# Patient Record
Sex: Male | Born: 1990 | Race: Black or African American | Hispanic: No | Marital: Single | Smoking: Never smoker
Health system: Southern US, Community
[De-identification: ages and names within clinical notes are randomized; demographics above are authoritative.]

---
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
## 2015-04-02 ENCOUNTER — Emergency Department (HOSPITAL_COMMUNITY)
Admission: EM | Admit: 2015-04-02 | Discharge: 2015-04-03 | Disposition: A | Discharge status: Home / Self Care | Payer: BLUE CROSS/BLUE SHIELD | Attending: Emergency Medicine | Admitting: Emergency Medicine

## 2015-04-02 DIAGNOSIS — Y9389 Activity, other specified: Secondary | ICD-10-CM | POA: Diagnosis not present

## 2015-04-02 DIAGNOSIS — S20311A Abrasion of right front wall of thorax, initial encounter: Secondary | ICD-10-CM | POA: Diagnosis not present

## 2015-04-02 DIAGNOSIS — S299XXA Unspecified injury of thorax, initial encounter: Secondary | ICD-10-CM | POA: Diagnosis present

## 2015-04-02 DIAGNOSIS — Y9289 Other specified places as the place of occurrence of the external cause: Secondary | ICD-10-CM | POA: Diagnosis not present

## 2015-04-02 DIAGNOSIS — Y249XXA Unspecified firearm discharge, undetermined intent, initial encounter: Secondary | ICD-10-CM

## 2015-04-02 DIAGNOSIS — Z23 Encounter for immunization: Secondary | ICD-10-CM | POA: Insufficient documentation

## 2015-04-02 DIAGNOSIS — S21101A Unspecified open wound of right front wall of thorax without penetration into thoracic cavity, initial encounter: Secondary | ICD-10-CM | POA: Insufficient documentation

## 2015-04-02 DIAGNOSIS — W3400XA Accidental discharge from unspecified firearms or gun, initial encounter: Secondary | ICD-10-CM | POA: Diagnosis not present

## 2015-04-02 DIAGNOSIS — Y998 Other external cause status: Secondary | ICD-10-CM | POA: Insufficient documentation
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
## 2015-04-03 ENCOUNTER — Encounter (HOSPITAL_COMMUNITY): Payer: Self-pay | Admitting: *Deleted

## 2015-04-03 ENCOUNTER — Emergency Department (HOSPITAL_COMMUNITY): Payer: BLUE CROSS/BLUE SHIELD

## 2015-04-03 MED ORDER — TETANUS-DIPHTH-ACELL PERTUSSIS 5-2.5-18.5 LF-MCG/0.5 IM SUSP
0.5000 mL | Freq: Once | INTRAMUSCULAR | Status: AC
  Administered 2015-04-03: 0.5 mL via INTRAMUSCULAR
  Filled 2015-04-03: qty 0.5
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
## 2015-04-03 NOTE — ED Notes (Addendum)
Wound cleaned and dressing applied.  [place] [employer] officers at bedside.
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
## 2015-04-09 ENCOUNTER — Emergency Department (HOSPITAL_COMMUNITY)
Admission: EM | Admit: 2015-04-09 | Discharge: 2015-04-09 | Disposition: A | Discharge status: Home / Self Care | Payer: BLUE CROSS/BLUE SHIELD | Attending: Emergency Medicine | Admitting: Emergency Medicine

## 2015-04-09 ENCOUNTER — Encounter (HOSPITAL_COMMUNITY): Payer: Self-pay

## 2015-04-09 DIAGNOSIS — Z4801 Encounter for change or removal of surgical wound dressing: Secondary | ICD-10-CM | POA: Insufficient documentation

## 2015-04-09 DIAGNOSIS — Z5189 Encounter for other specified aftercare: Secondary | ICD-10-CM

## 2015-04-09 DIAGNOSIS — Z720 Tobacco use: Secondary | ICD-10-CM | POA: Insufficient documentation

## 2015-04-09 NOTE — Discharge Instructions (Signed)
Your wound does not appear to be infected today.  Continue to wash the site twice daily, dry completely and let it air for an hour prior to applying Neosporin and dressing.  If you are not working, leaving it air longer would  be beneficial.
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
## 2018-07-25 NOTE — Progress Notes (Signed)
Psychiatric Initial Adult Assessment   Patient Identification: Troy Mahoney MRN:  [id number] Date of Evaluation:  07/26/2018 Referral Source: Self Chief Complaint:   Chief Complaint    Depression; Psychiatric Evaluation     Visit Diagnosis:    ICD-10-CM   1. MDD (major depressive disorder), recurrent episode, moderate (HCC) F33.1 TSH    History of Present Illness:   Troy Mahoney is a 28 y.o. year old male with a history of depression, hypertension , who is referred for depression.   He reports worsening in depression especially since his father deceased in 10/06/15.  His father deceased from heart attack.  He feels guilty about this, stating that he could have found out something to prevent it.  He also talks about his mother, who is on peritoneal dialysis.  He was called to go to the hospital in October for her mother.  She is on renal transplant list and he is a caregiver.  He feels significant pressure due to this.  He is also concerned what would happen if he were to lose her as she is the only person left for the patient.  Although he has a sister, she did not come to the hospital when they were called in October.  He feels like his back is against the wall.  He has been very isolated.  He has significant anhedonia, and stays in the bed most of the time.  He did not do anything on his birthday.  He also reports that he lost his grandmother and his aunt around his birthday in the past.   He feels depressed.  He has insomnia.  He has significant fatigue.  He has difficulty in concentration.  He has appetite loss, and he lost 8 pounds over the past 2 years.  He has SI of driving of bridge, although he denies any intent.  He feels anxious and tense.  He has panic attacks at least every week.  He denies decreased need for sleep.  He had euphoria for a brief period of time.  He had impulsive shopping of buying [employer] in 2017.  He had increased sexual activity with multiple partners last  year; it occurred "searching for happiness," but he denies any euphoria around that time. He has AH of voices of people talking about him. He has occasional AH of hurting other people (non specific), although he denies any attempt. He denies HI. He denies gun access at home. He denies alcohol use or drug use.    Associated Signs/Symptoms: Depression Symptoms:  depressed mood, anhedonia, insomnia, fatigue, suicidal thoughts without plan, (Hypo) Manic Symptoms:  Elevated Mood, Impulsivity, Irritable Mood, Anxiety Symptoms:  Excessive Worry, Panic Symptoms, Psychotic Symptoms:  Hallucinations: Auditory PTSD Symptoms: Had a traumatic exposure:  bullied at school  Past Psychiatric History:  Outpatient: seen counselor, weekly Psychiatry admission: denies  Previous suicide attempt: (tried to overdose aspirin in 2006 in the context of being teased) Past trials of medication: denies  History of violence: denies  Previous Psychotropic Medications: No   Substance Abuse History in the last 12 months:  No.  Consequences of Substance Abuse: Negative  Past Medical History: No past medical history on file. No past surgical history on file.  Family Psychiatric History:  As below. Father's siblings has alcohol use,   Family History:  Family History  Problem Relation Age of Onset  . Anxiety disorder Mother   . Depression Mother   . Anxiety disorder Maternal Aunt   . Anxiety  disorder Maternal Grandmother     Social History:   Social History   Socioeconomic History  . Marital status: Single    Spouse name: Not on file  . Number of children: Not on file  . Years of education: Not on file  . Highest education level: Not on file  Occupational History  . Not on file  Social Needs  . Financial resource strain: Not on file  . Food insecurity:    Worry: Not on file    Inability: Not on file  . Transportation needs:    Medical: Not on file    Non-medical: Not on file  Tobacco Use   . Smoking status: Never Smoker  . Smokeless tobacco: Never Used  Substance and Sexual Activity  . Alcohol use: Yes    Comment: occ.  . Drug use: No  . Sexual activity: Not on file  Lifestyle  . Physical activity:    Days per week: Not on file    Minutes per session: Not on file  . Stress: Not on file  Relationships  . Social connections:    Talks on phone: Not on file    Gets together: Not on file    Attends religious service: Not on file    Active member of club or organization: Not on file    Attends meetings of clubs or organizations: Not on file    Relationship status: Not on file  Other Topics Concern  . Not on file  Social History Narrative  . Not on file    Additional Social History:  Single, no children. He stays at his mother's He grew up [place]. He was raised by his maternal grandmother with his cousin after his parents moved to [place] so that he can go to the same school. His grandmother deceased several years ago. He reports good relationship with his parents. He was bullied at school due to the "way I look (referring to his sexual orientation)" Education: graduated from high school.  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 amLODipine (NORVASC) 5 MG tablet TAKE 1 TABLET BY MOUTH DAILY WITH OLMERSARTAN    . hydrochlorothiazide (MICROZIDE) 12.5 MG capsule Take 12.5 mg by mouth daily.    Marland Kitchen. ibuprofen (ADVIL,MOTRIN) 200 MG tablet Take 200 mg by mouth every 6 (six) hours as needed for mild pain or moderate pain.    Marland Kitchen. olmesartan (BENICAR) 20 MG  tablet TAKE 1 TABLET BY MOUTH DAILY FOR BLOOD PRESSURE WITH AMLODIPINE    . sertraline (ZOLOFT) 50 MG tablet 25 mg at night for one week, then 50 mg at night 30 tablet 0   No current facility-administered medications for this visit.     Musculoskeletal: Strength & Muscle Tone: within normal limits Gait & Station: normal Patient leans: N/A  Psychiatric Specialty Exam: Review of Systems  Psychiatric/Behavioral: Positive for depression, hallucinations and suicidal ideas. Negative for memory loss and substance abuse. The patient is nervous/anxious and has insomnia.   All other systems reviewed and are negative.   Blood pressure 137/76, pulse 99, height 6\' 3"  (1.905 m), weight 253 lb (  114.8 kg), SpO2 99 %.Body mass index is 31.62 kg/m.  General Appearance: Fairly Groomed  Eye Contact:  Good  Speech:  Clear and Coherent  Volume:  Normal  Mood:  Depressed  Affect:  Appropriate, Congruent, Restricted and Tearful  Thought Process:  Coherent  Orientation:  Full (Time, Place, and Person)  Thought Content:  Logical  Suicidal Thoughts:  Yes.  without intent/plan  Homicidal Thoughts:  No  Memory:  Immediate;   Good  Judgement:  Good  Insight:  Good  Psychomotor Activity:  Normal  Concentration:  Concentration: Good and Attention Span: Good  Recall:  Good  Fund of Knowledge:Good  Language: Good  Akathisia:  No  Handed:  Right  AIMS (if indicated):  not done  Assets:  Communication Skills Desire for Improvement  ADL's:  Intact  Cognition: WNL  Sleep:  Poor   Screenings:   Assessment and Plan:  Cora CollumLedonimon W Parks is a 28 y.o. year old male with a history of depression, hypertension , who is referred for depression.   # MDD, moderate, recurrent without psychotic features Patient reports worsening in depressive symptoms in the context of losing his father in March 2017, and being a caregiver of her mother who is on renal transplant list.  Will start sertraline to target depression.   Discussed potential side effect of drowsiness and GI symptoms.  Noted that he reports subthreshold hypomanic symptoms in the past (euphoria, impulsive shopping of buying DMV); will continue to monitor. Will consider adding antipsychotics in the future if he has limited benefit from sertraline.   Plan 1. Start sertraline 25 mg at night for one week, then 50 mg at night  2. Return to clinic in one month for 30 mins 3. Check TSH - He will continue to see a therapist (he is not interested in IOP/PHP) Emergency resources which includes 911, ED, suicide crisis line [phone]) are discussed.   I would support that he remain out of work due to severity of his symptoms.  He has symptoms of depression, which interferes with his ability to complete tasks or go to work regularly.  Expected return to work date April 1st.   The patient demonstrates the following risk factors for suicide: Chronic risk factors for suicide include: psychiatric disorder of depression. Acute risk factors for suicide include: loss (financial, interpersonal, professional). Protective factors for this patient include: responsibility to others (children, family), coping skills and hope for the future. Considering these factors, the overall suicide risk at this point appears to be low. Patient is appropriate for outpatient follow up.    Neysa Hottereina Grady Lucci, MD 1/23/20203:46 PM

## 2018-07-26 ENCOUNTER — Ambulatory Visit (INDEPENDENT_AMBULATORY_CARE_PROVIDER_SITE_OTHER): Payer: 59 | Admitting: Psychiatry

## 2018-07-26 ENCOUNTER — Encounter (HOSPITAL_COMMUNITY): Payer: Self-pay | Admitting: Psychiatry

## 2018-07-26 VITALS — BP 137/76 | HR 99 | Ht 75.0 in | Wt 253.0 lb

## 2018-07-26 DIAGNOSIS — F331 Major depressive disorder, recurrent, moderate: Secondary | ICD-10-CM

## 2018-07-26 MED ORDER — SERTRALINE HCL 50 MG PO TABS: ORAL_TABLET | ORAL | 0 refills | Status: DC

## 2018-07-26 NOTE — Patient Instructions (Addendum)
1. Start sertraline 25 mg at night for one week, then 50 mg at night  2. Return to clinic in one month for 30 mins 3. Check TSH 4. CONTACT INFORMATION  What to do if you need to get in touch with someone regarding a psychiatric issue:  1. EMERGENCY: For psychiatric emergencies (if you are suicidal or if there are any other safety issues) call 911 and/or go to your nearest Emergency Room immediately.   2. IF YOU NEED SOMEONE TO TALK TO RIGHT NOW: Given my clinical responsibilities, I may not be able to speak with you over the phone for a prolonged period of time.  a. You may always call The National Suicide Prevention Lifeline at 1-800-273-TALK [phone]).  b. Your county of residence will also have local crisis services. For [facility]: [facility] at [phone] (24 Hour Crisis Hotline)

## 2018-08-22 NOTE — Progress Notes (Signed)
BH MD/PA/NP OP Progress Note  08/28/2018 2:01 PM Troy Mahoney  MRN:  [id number]  Chief Complaint:  Chief Complaint    Follow-up; Depression     HPI:  Patient presents for follow-up appointment for depression.  He states that he has had worsening in insomnia.  He also notices himself to be more irritable, which he attributes to insomnia.  Although he notices less panic attacks after starting sertraline, he prefers to try other medication due to concern of side effect.  He tries not to think about negatives.  He is unsure what would happen as there is an two year anniversary of his father's death in a few weeks.  He also had a big argument with his mother. He does not think he is ready to go back to work due to insomnia he experiences.  Although he tries to have structuring the day , it has been difficult for him to get through with the plans. He also reports difficulty in concentration.  He has initial and middle insomnia.  He has increased appetite; he spent more than $50 on food.  He feels fatigue.  He denies SI.  He feels less anxious.  He denies decreased need for sleep or euphoria.   Wt Readings from Last 3 Encounters:  08/28/18 251 lb (113.9 kg)  07/26/18 253 lb (114.8 kg)  04/09/15 205 lb (93 kg)    Visit Diagnosis:    ICD-10-CM   1. MDD (major depressive disorder), recurrent episode, moderate (HCC) F33.1     Past Psychiatric History: Please see initial evaluation for full details. I have reviewed the history. No updates at this time.     Past Medical History: No past medical history on file. No past surgical history on file.  Family Psychiatric History: Please see initial evaluation for full details. I have reviewed the history. No updates at this time.     Family History:  Family History  Problem Relation Age of Onset  . Anxiety disorder Mother   . Depression Mother   . Anxiety disorder Maternal Aunt   . Anxiety disorder Maternal Grandmother     Social History:   Social History   Socioeconomic History  . Marital status: Single    Spouse name: Not on file  . Number of children: Not on file  . Years of education: Not on file  . Highest education level: Not on file  Occupational History  . Not on file  Social Needs  . Financial resource strain: Not on file  . Food insecurity:    Worry: Not on file    Inability: Not on file  . Transportation needs:    Medical: Not on file    Non-medical: Not on file  Tobacco Use  . Smoking status: Never Smoker  . Smokeless tobacco: Never Used  Substance and Sexual Activity  . Alcohol use: Yes    Comment: occ.  . Drug use: No  . Sexual activity: Not on file  Lifestyle  . Physical activity:    Days per week: Not on file    Minutes per session: Not on file  . Stress: Not on file  Relationships  . Social connections:    Talks on phone: Not on file    Gets together: Not on file    Attends religious service: Not on file    Active member of club or organization: Not on file    Attends meetings of clubs or organizations: Not on file    Relationship  status: Not on file  Other Topics Concern  . Not on file  Social History Narrative  . Not on file    Allergies: No Known Allergies  Metabolic Disorder Labs: No results found for: HGBA1C, MPG No results found for: PROLACTIN No results found for: CHOL, TRIG, HDL, CHOLHDL, VLDL, LDLCALC No results found for: TSH  Therapeutic Level Labs: No results found for: LITHIUM No results found for: VALPROATE No components found for:  CBMZ  Current Medications: Current Outpatient Medications  Medication Sig Dispense Refill  . amLODipine (NORVASC) 5 MG tablet TAKE 1 TABLET BY MOUTH DAILY WITH OLMERSARTAN    . hydrochlorothiazide (MICROZIDE) 12.5 MG capsule Take 12.5 mg by mouth daily.    Marland Kitchen ibuprofen (ADVIL,MOTRIN) 200 MG tablet Take 200 mg by mouth every 6 (six) hours as needed for mild pain or moderate pain.    Marland Kitchen olmesartan (BENICAR) 20 MG tablet TAKE 1  TABLET BY MOUTH DAILY FOR BLOOD PRESSURE WITH AMLODIPINE    . escitalopram (LEXAPRO) 10 MG tablet 5 mg daily for one week, then 10 mg daily 30 tablet 0  . traZODone (DESYREL) 50 MG tablet 25-50 mg at night as needed for sleep 30 tablet 0   No current facility-administered medications for this visit.      Musculoskeletal: Strength & Muscle Tone: within normal limits Gait & Station: normal Patient leans: N/A  Psychiatric Specialty Exam: Review of Systems  Psychiatric/Behavioral: Positive for depression. Negative for hallucinations, memory loss, substance abuse and suicidal ideas. The patient is nervous/anxious and has insomnia.   All other systems reviewed and are negative.   Blood pressure (!) 158/92, pulse 82, height 6\' 3"  (1.905 m), weight 251 lb (113.9 kg), SpO2 97 %.Body mass index is 31.37 kg/m.  General Appearance: Fairly Groomed  Eye Contact:  Good  Speech:  Clear and Coherent  Volume:  Normal  Mood:  Depressed  Affect:  Appropriate, Congruent, Restricted and down  Thought Process:  Coherent  Orientation:  Full (Time, Place, and Person)  Thought Content: Logical   Suicidal Thoughts:  No  Homicidal Thoughts:  No  Memory:  Immediate;   Good  Judgement:  Good  Insight:  Fair  Psychomotor Activity:  Normal  Concentration:  Concentration: Good and Attention Span: Good  Recall:  Good  Fund of Knowledge: Good  Language: Good  Akathisia:  No  Handed:  Right  AIMS (if indicated): not done  Assets:  Communication Skills Desire for Improvement  ADL's:  Intact  Cognition: WNL  Sleep:  Poor   Screenings:   Assessment and Plan:  Troy Mahoney is a 28 y.o. year old male with a history of depression, hypertension , who presents for follow up appointment for MDD (major depressive disorder), recurrent episode, moderate (HCC)  # MDD, moderate, recurrent without psychotic features Although patient reports improvement in depression and anxiety, he reports worsening  irritability and insomnia after starting sertraline.  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  Will cross taper from sertraline to Lexapro to target depression.  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  Will continue to monitor.   # Hypertension Discussed regular exercise.  He is advised to follow-up with his PCP (he is searching for the one).  Plan 1. Decrease sertraline 25 mg daily  for one week, then discontinue  2. Start lexapro 5 mg daily for one week, then 10 mg daily  3. Start Trazodone 25-50 mg at night as needed for sleep 3. Return to clinic in one month for 30 mins 4. Check TSH - He will continue to see a therapist (he is not interested in IOP/PHP)  Patient continues to report depressive symptoms and anxiety, which interferes with his ability to complete tasks or go to work regularly.  I would support he remain out of work until April 1.   Past trials of medication: sertraline (insomnia)   The patient demonstrates the following risk factors for suicide: Chronic risk factors for suicide include: psychiatric disorder of depression. Acute risk factors for suicide include: loss (financial, interpersonal, professional). Protective factors for this patient include: responsibility to others (children, family), coping skills and hope for the future. Considering these factors, the overall suicide risk at this point appears to be low. Patient is appropriate for outpatient follow up.  The duration of this appointment visit was 25 minutes of face-to-face time with the patient.  Greater than 50% of this time was spent in counseling, explanation of  diagnosis, planning of further management, and coordination of care.  Neysa Hottereina Lenah Messenger, MD 08/28/2018, 2:01 PM

## 2018-08-28 ENCOUNTER — Encounter (HOSPITAL_COMMUNITY): Payer: Self-pay | Admitting: Psychiatry

## 2018-08-28 ENCOUNTER — Ambulatory Visit (INDEPENDENT_AMBULATORY_CARE_PROVIDER_SITE_OTHER): Payer: 59 | Admitting: Psychiatry

## 2018-08-28 VITALS — BP 158/92 | HR 82 | Ht 75.0 in | Wt 251.0 lb

## 2018-08-28 DIAGNOSIS — F331 Major depressive disorder, recurrent, moderate: Secondary | ICD-10-CM

## 2018-08-28 MED ORDER — ESCITALOPRAM OXALATE 10 MG PO TABS: ORAL_TABLET | ORAL | 0 refills | Status: DC

## 2018-08-28 MED ORDER — TRAZODONE HCL 50 MG PO TABS: ORAL_TABLET | ORAL | 0 refills | Status: DC

## 2018-08-28 NOTE — Patient Instructions (Addendum)
1. Decrease sertraline 25 mg daily for one week, then discontinue  2. Start lexapro 5 mg daily for one week, then 10 mg daily  3. Start Trazodone 25-50 mg at night as needed for sleep 3. Return to clinic in one month for 30 mins

## 2018-08-29 LAB — TSH: TSH: 0.69 mIU/L (ref 0.40–4.50)

## 2018-09-19 NOTE — Progress Notes (Signed)
BH MD/PA/NP OP Progress Note  09/25/2018 1:34 PM Troy Mahoney  MRN:  [id number]  Chief Complaint:  Chief Complaint    Follow-up; Depression     HPI:  Patient presents for follow-up appointment for depression.  He states that he had crying spells last night as today is his father's anniversary.  He is hoping to get to the point that he accepts that he is not here anymore.  He hopes to have more balance since his life.  He is planning to go to the grave today.  He wants to try going back to work as they are now transitioning to telehealth.  He feels ready for the job, and he also believes that it will be helpful for him to distract himself rather than ruminating on things.  He sleeps better, and reports good benefit from trazodone.  He feels less depressed.  He has more motivation and energy.  He denies SI.  He feels anxious and tense at times.  He had 2 panic attacks; one occurred when he was lying in the bed, thinking about many things. He has less irritability.   Visit Diagnosis:    ICD-10-CM   1. MDD (major depressive disorder), recurrent episode, moderate (HCC) F33.1     Past Psychiatric History: Please see initial evaluation for full details. I have reviewed the history. No updates at this time.     Past Medical History: No past medical history on file. No past surgical history on file.  Family Psychiatric History: Please see initial evaluation for full details. I have reviewed the history. No updates at this time.     Family History:  Family History  Problem Relation Age of Onset  . Anxiety disorder Mother   . Depression Mother   . Anxiety disorder Maternal Aunt   . Anxiety disorder Maternal Grandmother     Social History:  Social History   Socioeconomic History  . Marital status: Single    Spouse name: Not on file  . Number of children: Not on file  . Years of education: Not on file  . Highest education level: Not on file  Occupational History  . Not on file   Social Needs  . Financial resource strain: Not on file  . Food insecurity:    Worry: Not on file    Inability: Not on file  . Transportation needs:    Medical: Not on file    Non-medical: Not on file  Tobacco Use  . Smoking status: Never Smoker  . Smokeless tobacco: Never Used  Substance and Sexual Activity  . Alcohol use: Yes    Comment: occ.  . Drug use: No  . Sexual activity: Not on file  Lifestyle  . Physical activity:    Days per week: Not on file    Minutes per session: Not on file  . Stress: Not on file  Relationships  . Social connections:    Talks on phone: Not on file    Gets together: Not on file    Attends religious service: Not on file    Active member of club or organization: Not on file    Attends meetings of clubs or organizations: Not on file    Relationship status: Not on file  Other Topics Concern  . Not on file  Social History Narrative  . Not on file    Allergies: No Known Allergies  Metabolic Disorder Labs: No results found for: HGBA1C, MPG No results found for: PROLACTIN No results  found for: CHOL, TRIG, HDL, CHOLHDL, VLDL, LDLCALC Lab Results  Component Value Date   TSH 0.69 08/28/2018    Therapeutic Level Labs: No results found for: LITHIUM No results found for: VALPROATE No components found for:  CBMZ  Current Medications: Current Outpatient Medications  Medication Sig Dispense Refill  . amLODipine (NORVASC) 5 MG tablet TAKE 1 TABLET BY MOUTH DAILY WITH OLMERSARTAN    . escitalopram (LEXAPRO) 10 MG tablet Take 1 tablet (10 mg total) by mouth daily. 30 tablet 0  . hydrochlorothiazide (MICROZIDE) 12.5 MG capsule Take 12.5 mg by mouth daily.    Marland Kitchen ibuprofen (ADVIL,MOTRIN) 200 MG tablet Take 200 mg by mouth every 6 (six) hours as needed for mild pain or moderate pain.    Marland Kitchen olmesartan (BENICAR) 20 MG tablet TAKE 1 TABLET BY MOUTH DAILY FOR BLOOD PRESSURE WITH AMLODIPINE    . traZODone (DESYREL) 50 MG tablet 25-50 mg at night as needed  for sleep 30 tablet 0   No current facility-administered medications for this visit.      Musculoskeletal: Strength & Muscle Tone: within normal limits Gait & Station: normal Patient leans: N/A  Psychiatric Specialty Exam: Review of Systems  Psychiatric/Behavioral: Positive for depression. Negative for hallucinations, memory loss, substance abuse and suicidal ideas. The patient is nervous/anxious and has insomnia.   All other systems reviewed and are negative.   Blood pressure (!) 150/90, pulse 80, height  (1.905 m), weight 248 lb (112.5 kg).Body mass index is 31 kg/m.  General Appearance: Fairly Groomed, wearing sunglass, mask  Eye Contact:  Good  Speech:  Clear and Coherent  Volume:  Normal  Mood:  "better"  Affect:  Appropriate and Congruent (difficult to evaluate due to him wearing sunglass)  Thought Process:  Coherent  Orientation:  Full (Time, Place, and Person)  Thought Content: Logical   Suicidal Thoughts:  No  Homicidal Thoughts:  No  Memory:  Immediate;   Good  Judgement:  Good  Insight:  Good  Psychomotor Activity:  Normal  Concentration:  Concentration: Good and Attention Span: Good  Recall:  Good  Fund of Knowledge: Good  Language: Good  Akathisia:  No  Handed:  Right  AIMS (if indicated): not done  Assets:  Communication Skills Desire for Improvement  ADL's:  Intact  Cognition: WNL  Sleep:  Fair   Screenings:   Assessment and Plan:  Troy Mahoney is a 28 y.o. year old male with a history of depression, hypertension , who presents for follow up appointment for MDD (major depressive disorder), recurrent episode, moderate (HCC)  # MDD, moderate, recurrent without psychotic features Patient reports overall improvement in depressive symptoms and anxiety since switching from sertraline to Lexapro.  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  Will  continue current dose of Lexapro to see if it exerts more benefit.  Noted that although he had history of subthreshold hypomanic symptoms of impulsive shopping of buying [employer], his symptoms appear to be more related to manic defense rather than bipolar disorder.  Will continue to monitor.   # Hypertension He is in transition to find PCP.  Discussed behavioral activation.   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 I would support this given improvement in his symptoms.  Plan 1. Continue lexapro 10 mg daily  2. Continue Trazodone 25-50 mg at night as needed for sleep  3. Return to clinic in one month for 30 mins . TSH reviewed; wnl 0.69 - He will continue to see a therapist (he is not interested in IOP/PHP)  Past trials of medication:sertraline (insomnia)  The patient demonstrates the following risk factors for suicide: Chronic risk factors for suicide include:psychiatric disorder ofdepression. Acute risk factorsfor suicide include: loss (financial, interpersonal, professional). Protective factorsfor this patient include: responsibility to others (children, family), coping skills and hope for the future. Considering these factors, the overall suicide risk at this point appears to below. Patientisappropriate for outpatient follow up.  The duration of this appointment visit was 25 minutes of face-to-face time with the patient.  Greater than 50% of this time was spent in counseling, explanation of  diagnosis, planning of further management, and coordination of care.  [name], MD 09/25/2018, 1:34 PM

## 2018-09-25 ENCOUNTER — Other Ambulatory Visit: Payer: Self-pay

## 2018-09-25 ENCOUNTER — Ambulatory Visit (INDEPENDENT_AMBULATORY_CARE_PROVIDER_SITE_OTHER): Payer: 59 | Admitting: Psychiatry

## 2018-09-25 ENCOUNTER — Encounter (HOSPITAL_COMMUNITY): Payer: Self-pay | Admitting: Psychiatry

## 2018-09-25 VITALS — BP 150/90 | HR 80 | Ht 75.0 in | Wt 248.0 lb

## 2018-09-25 DIAGNOSIS — F331 Major depressive disorder, recurrent, moderate: Secondary | ICD-10-CM | POA: Diagnosis not present

## 2018-09-25 DIAGNOSIS — R450 Nervousness: Secondary | ICD-10-CM | POA: Diagnosis not present

## 2018-09-25 DIAGNOSIS — G4709 Other insomnia: Secondary | ICD-10-CM

## 2018-09-25 DIAGNOSIS — Z818 Family history of other mental and behavioral disorders: Secondary | ICD-10-CM | POA: Diagnosis not present

## 2018-09-25 MED ORDER — ESCITALOPRAM OXALATE 10 MG PO TABS: 10.0000 mg | ORAL_TABLET | Freq: Every day | ORAL | 0 refills | Status: DC

## 2018-09-25 NOTE — Patient Instructions (Signed)
1. Continue lexapro 10 mg daily  2. Continue Trazodone 25-50 mg at night as needed for sleep 3. Return to clinic in one month for 30 mins

## 2018-10-24 NOTE — Progress Notes (Signed)
Virtual Visit via Video Note  I connected with Troy Mahoney on 10/29/18 at  1:30 PM EDT by a video enabled telemedicine application and verified that I am speaking with the correct person using two identifiers.   I discussed the limitations of evaluation and management by telemedicine and the availability of in person appointments. The patient expressed understanding and agreed to proceed.   I discussed the assessment and treatment plan with the patient. The patient was provided an opportunity to ask questions and all were answered. The patient agreed with the plan and demonstrated an understanding of the instructions.   The patient was advised to call back or seek an in-person evaluation if the symptoms worsen or if the condition fails to improve as anticipated.  I provided 25 minutes of non-face-to-face time during this encounter.   Neysa Hottereina Karee Forge, MD    [facility]  10/29/2018 2:17 PM Troy Mahoney  MRN:  [id number]  Chief Complaint:  Chief Complaint    Depression; Follow-up     HPI:  This is a follow-up visit for depression.  He states that although he will return to work, he could not continue it after trying for 9 days.  He has been out of work since April 15.  He was very overwhelmed and had significant difficulty in concentration when he worked.  He was very stressed especially by the work environment, which was "hostile" and lacks of structure. He felt micro managed and "checked out".  He felt very frustrated about the situation as he loves his work and wants to work. He becomes tearful, stating that he feels like "less of a person" as he is unable to work.  He is willing to try half a as he usually feels better in the morning.  He will try to talk with this medication.  He sleeps well, although he did have significant insomnia when he used to work.  He feels fatigue.  He feels depressed.  He has fair appetite.  He denies SI.  He feels anxious and tense at  times.  He denies irritability.   Visit Diagnosis:    ICD-10-CM   1. MDD (major depressive disorder), recurrent episode, moderate (HCC) F33.1     Past Psychiatric History: Please see initial evaluation for full details. I have reviewed the history. No updates at this time.     Past Medical History: No past medical history on file. No past surgical history on file.  Family Psychiatric History: Please see initial evaluation for full details. I have reviewed the history. No updates at this time.     Family History:  Family History  Problem Relation Age of Onset  . Anxiety disorder Mother   . Depression Mother   . Anxiety disorder Maternal Aunt   . Anxiety disorder Maternal Grandmother     Social History:  Social History   Socioeconomic History  . Marital status: Single    Spouse name: Not on file  . Number of children: Not on file  . Years of education: Not on file  . Highest education level: Not on file  Occupational History  . Not on file  Social Needs  . Financial resource strain: Not on file  . Food insecurity:    Worry: Not on file    Inability: Not on file  . Transportation needs:    Medical: Not on file    Non-medical: Not on file  Tobacco Use  . Smoking status: Never Smoker  .  Smokeless tobacco: Never Used  Substance and Sexual Activity  . Alcohol use: Yes    Comment: occ.  . Drug use: No  . Sexual activity: Not on file  Lifestyle  . Physical activity:    Days per week: Not on file    Minutes per session: Not on file  . Stress: Not on file  Relationships  . Social connections:    Talks on phone: Not on file    Gets together: Not on file    Attends religious service: Not on file    Active member of club or organization: Not on file    Attends meetings of clubs or organizations: Not on file    Relationship status: Not on file  Other Topics Concern  . Not on file  Social History Narrative  . Not on file    Allergies: No Known  Allergies  Metabolic Disorder Labs: No results found for: HGBA1C, MPG No results found for: PROLACTIN No results found for: CHOL, TRIG, HDL, CHOLHDL, VLDL, LDLCALC Lab Results  Component Value Date   TSH 0.69 08/28/2018    Therapeutic Level Labs: No results found for: LITHIUM No results found for: VALPROATE No components found for:  CBMZ  Current Medications: Current Outpatient Medications  Medication Sig Dispense Refill  . amLODipine (NORVASC) 5 MG tablet TAKE 1 TABLET BY MOUTH DAILY WITH OLMERSARTAN    . escitalopram (LEXAPRO) 20 MG tablet Take 1 tablet (20 mg total) by mouth daily. 90 tablet 0  . hydrochlorothiazide (MICROZIDE) 12.5 MG capsule Take 12.5 mg by mouth daily.    Marland Kitchen ibuprofen (ADVIL,MOTRIN) 200 MG tablet Take 200 mg by mouth every 6 (six) hours as needed for mild pain or moderate pain.    Marland Kitchen olmesartan (BENICAR) 20 MG tablet TAKE 1 TABLET BY MOUTH DAILY FOR BLOOD PRESSURE WITH AMLODIPINE    . traZODone (DESYREL) 50 MG tablet Take 1 tablet (50 mg total) by mouth at bedtime as needed for sleep. 90 tablet 0   No current facility-administered medications for this visit.      Musculoskeletal: Strength & Muscle Tone: N/A Gait & Station: N/A Patient leans: N/A  Psychiatric Specialty Exam: Review of Systems  Psychiatric/Behavioral: Positive for depression. Negative for hallucinations, memory loss, substance abuse and suicidal ideas. The patient is nervous/anxious and has insomnia.   All other systems reviewed and are negative.   There were no vitals taken for this visit.There is no height or weight on file to calculate BMI.  General Appearance: Fairly Groomed  Eye Contact:  Good  Speech:  Clear and Coherent  Volume:  Normal  Mood:  Anxious  Affect:  Appropriate, Congruent and tense  Thought Process:  Coherent  Orientation:  Full (Time, Place, and Person)  Thought Content: Logical   Suicidal Thoughts:  No  Homicidal Thoughts:  No  Memory:  Immediate;   Good   Judgement:  Good  Insight:  Fair  Psychomotor Activity:  Normal  Concentration:  Concentration: Good and Attention Span: Good  Recall:  Good  Fund of Knowledge: Good  Language: Good  Akathisia:  No  Handed:  Right  AIMS (if indicated): not done  Assets:  Communication Skills Desire for Improvement  ADL's:  Intact  Cognition: WNL  Sleep:  Poor   Screenings:   Assessment and Plan:  Troy Mahoney is a 28 y.o. year old male with a history of depression, hypertension, who presents for follow up appointment for MDD (major depressive disorder), recurrent episode, moderate (HCC)  #  MDD, moderate, recurrent without psychotic features There has been worsening in depressive symptoms in the context of returning to work.  Other psychosocial stressors includes grief of loss of his father in March 2017, and being a caregiver of his mother, who is on renal transplant.  Will uptitrate Lexapro to target depression.  Will continue trazodone as needed for insomnia.  Noted that although he has history of subthreshold hypomanic symptoms of impulsive shopping of buying [employer], it appears to be more related to manic defense rather than bipolar disorder.  Will continue to monitor.   Patient had relapsed in depressive symptoms in the context of returning to work. Would support him to work half days as a transition, starting May 4th before returning to work full time.   Plan 1. Increase lexapro 20 mg daily  2. Continue Trazodone 50 mg at night as needed for sleep 3. Next appointment: 5/28 at 4:20 for 30 mins . TSH reviewed; wnl 0.69 - He will continue to see a therapist (he is not interested in IOP/PHP)  Past trials of medication:sertraline (insomnia)   The patient demonstrates the following risk factors for suicide: Chronic risk factors for suicide include:psychiatric disorder ofdepression. Acute risk factorsfor suicide include: loss (financial, interpersonal, professional). Protective  factorsfor this patient include: responsibility to others (children, family), coping skills and hope for the future. Considering these factors, the overall suicide risk at this point appears to below. Patientisappropriate for outpatient follow up.  The duration of this appointment visit was 25 minutes of non face-to-face time with the patient.  Greater than 50% of this time was spent in counseling, explanation of  diagnosis, planning of further management, and coordination of care.  [name], MD 10/29/2018, 2:17 PM

## 2018-10-29 ENCOUNTER — Other Ambulatory Visit: Payer: Self-pay

## 2018-10-29 ENCOUNTER — Ambulatory Visit (INDEPENDENT_AMBULATORY_CARE_PROVIDER_SITE_OTHER): Payer: 59 | Admitting: Psychiatry

## 2018-10-29 ENCOUNTER — Encounter (HOSPITAL_COMMUNITY): Payer: Self-pay | Admitting: Psychiatry

## 2018-10-29 DIAGNOSIS — F331 Major depressive disorder, recurrent, moderate: Secondary | ICD-10-CM

## 2018-10-29 MED ORDER — TRAZODONE HCL 50 MG PO TABS: 50.0000 mg | ORAL_TABLET | Freq: Every evening | ORAL | 0 refills | Status: DC | PRN

## 2018-10-29 MED ORDER — ESCITALOPRAM OXALATE 20 MG PO TABS: 20.0000 mg | ORAL_TABLET | Freq: Every day | ORAL | 0 refills | Status: DC

## 2018-10-29 NOTE — Patient Instructions (Signed)
1. Increase lexapro 20 mg daily  2. Continue Trazodone 25-50 mg at night as needed for sleep 3. Next appointment: 5/28 at 4:20 for 30 mins

## 2018-11-23 NOTE — Progress Notes (Addendum)
Virtual Visit via Video Note  I connected with Troy Mahoney on 11/29/18 at  4:20 PM EDT by a video enabled telemedicine application and verified that I am speaking with the correct person using two identifiers.   I discussed the limitations of evaluation and management by telemedicine and the availability of in person appointments. The patient expressed understanding and agreed to proceed.    I discussed the assessment and treatment plan with the patient. The patient was provided an opportunity to ask questions and all were answered. The patient agreed with the plan and demonstrated an understanding of the instructions.   The patient was advised to call back or seek an in-person evaluation if the symptoms worsen or if the condition fails to improve as anticipated.  I provided 25 minutes of non-face-to-face time during this encounter.   [name], MD    [facility] MD/PA/NP OP Progress Note  11/29/2018 4:51 PM Troy Mahoney  MRN:  [id number]  Chief Complaint:  Chief Complaint    Follow-up; Depression     HPI:  This is a follow-up appointment for depression.  He states that this is the second day he return to work.  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 He is unsure how it is going as he has just returned to work. He already feels anxious as other worker may return to work.  He states that has been very irritable.  He tends to feel more irritable when he feels that people does not understand him.  He has "rocky" relationship with his mother. He was very angry when she asked the patient to do something while he was not notified of it (while she states that she called him before).  He was very angry, throwing things at her, and went out for trying to cool off. Although he has "violent thought" when he is angry, he denies any intent or plan. He also had passive SI during this event, although he denies any intent/plan.  He states that he has been alternating taking lexapro (takes 10  mg every other day). He has not tried 20 mg, which was instructed at the last visit; he was concerned that he is "addicted" to the medication. After provided psycho education, he agrees to take medication regularly.  He sleeps well.  He feels fatigue.  He has anhedonia.  Has difficulty in concentration.  Denies SI.  He feels anxious and tense at times.  Denies panic attacks.  He denies decreased need for sleep or euphoria.    Visit Diagnosis:    ICD-10-CM   1. MDD (major depressive disorder), recurrent episode, moderate (HCC) F33.1     Past Psychiatric History: Please see initial evaluation for full details. I have reviewed the history. No updates at this time.     Past Medical History: History reviewed. No pertinent past medical history. History reviewed. No pertinent surgical history.  Family Psychiatric History: Please see initial evaluation for full details. I have reviewed the history. No updates at this time.     Family History:  Family History  Problem Relation Age of Onset  . Anxiety disorder Mother   . Depression Mother   . Anxiety disorder Maternal Aunt   . Anxiety disorder Maternal Grandmother     Social History:  Social History   Socioeconomic History  . Marital status: Single    Spouse name: Not on file  . Number of children: Not on file  . Years of education: Not  on file  . Highest education level: Not on file  Occupational History  . Not on file  Social Needs  . Financial resource strain: Not on file  . Food insecurity:    Worry: Not on file    Inability: Not on file  . Transportation needs:    Medical: Not on file    Non-medical: Not on file  Tobacco Use  . Smoking status: Never Smoker  . Smokeless tobacco: Never Used  Substance and Sexual Activity  . Alcohol use: Yes    Comment: occ.  . Drug use: No  . Sexual activity: Not on file  Lifestyle  . Physical activity:    Days per week: Not on file    Minutes per session: Not on file  . Stress: Not  on file  Relationships  . Social connections:    Talks on phone: Not on file    Gets together: Not on file    Attends religious service: Not on file    Active member of club or organization: Not on file    Attends meetings of clubs or organizations: Not on file    Relationship status: Not on file  Other Topics Concern  . Not on file  Social History Narrative  . Not on file    Allergies: No Known Allergies  Metabolic Disorder Labs: No results found for: HGBA1C, MPG No results found for: PROLACTIN No results found for: CHOL, TRIG, HDL, CHOLHDL, VLDL, LDLCALC Lab Results  Component Value Date   TSH 0.69 08/28/2018    Therapeutic Level Labs: No results found for: LITHIUM No results found for: VALPROATE No components found for:  CBMZ  Current Medications: Current Outpatient Medications  Medication Sig Dispense Refill  . amLODipine (NORVASC) 5 MG tablet TAKE 1 TABLET BY MOUTH DAILY WITH OLMERSARTAN    . escitalopram (LEXAPRO) 20 MG tablet Take 1 tablet (20 mg total) by mouth daily. 90 tablet 0  . hydrochlorothiazide (MICROZIDE) 12.5 MG capsule Take 12.5 mg by mouth daily.    Marland Kitchen ibuprofen (ADVIL,MOTRIN) 200 MG tablet Take 200 mg by mouth every 6 (six) hours as needed for mild pain or moderate pain.    Marland Kitchen olmesartan (BENICAR) 20 MG tablet TAKE 1 TABLET BY MOUTH DAILY FOR BLOOD PRESSURE WITH AMLODIPINE    . traZODone (DESYREL) 50 MG tablet Take 1 tablet (50 mg total) by mouth at bedtime as needed for sleep. 90 tablet 0   No current facility-administered medications for this visit.      Musculoskeletal: Strength & Muscle Tone: N/A Gait & Station: N/A Patient leans: N/A  Psychiatric Specialty Exam: Review of Systems  Psychiatric/Behavioral: Positive for depression. Negative for hallucinations, memory loss, substance abuse and suicidal ideas. The patient is nervous/anxious. The patient does not have insomnia.   All other systems reviewed and are negative.   There were no  vitals taken for this visit.There is no height or weight on file to calculate BMI.  General Appearance: Fairly Groomed  Eye Contact:  Good  Speech:  Clear and Coherent  Volume:  Normal  Mood:  Anxious and Depressed  Affect:  Appropriate, Congruent and Restricted  Thought Process:  Coherent  Orientation:  Full (Time, Place, and Person)  Thought Content: Logical   Suicidal Thoughts:  No  Homicidal Thoughts:  No  Memory:  Immediate;   Good  Judgement:  Good  Insight:  Present  Psychomotor Activity:  Normal  Concentration:  Concentration: Good and Attention Span: Good  Recall:  Good  Fund of Knowledge: Good  Language: Good  Akathisia:  No  Handed:  Right  AIMS (if indicated): not done  Assets:  Communication Skills Desire for Improvement  ADL's:  Intact  Cognition: WNL  Sleep:  Good   Screenings:   Assessment and Plan:  Troy CollumLedonimon W Maclachlan is a 28 y.o. year old male with a history of depression, hypertension , who presents for follow up appointment for MDD (major depressive disorder), recurrent episode, moderate (HCC)  # MDD, moderate, recurrent without psychotic features He continues to report depressive symptoms and irritability in the context of non adherence to medication.  Other psychosocial stressors includes work, and grief of loss of his father in March 2017, and conflict with his mother, who is on renal transplant.  Spent significant time discussing the importance of adherence to medication.  Will uptitrate Lexapro to target depression.  Will continue trazodone as needed for insomnia.  Noted that although he did report history of subthreshold hypomanic symptoms of impulsive shopping of buying [employer], it was more related to manic defense rather than bipolar disorder.  Will continue to monitor.  Discussed behavioral activation. He is interested in transfer therapist to this clinic; will make a referral.   He continues to have mood symptoms, which interferes with his ability to  work full day. I would support that he remain work with restriction; five hours per work until August 1st.   Plan I have reviewed and updated plans as below 1. Increase lexapro 20 mg daily  2. Continue Trazodone50 mg at night as needed for sleep 3. Next appointment: 6/25 at 3 PM for 30 mins, video - Referral to therapy - Will fill out paperwork  TSH reviewed; wnl0.69  Past trials of medication:sertraline (insomnia)             The patient demonstrates the following risk factors for suicide: Chronic risk factors for suicide include:psychiatric disorder ofdepression. Acute risk factorsfor suicide include: loss (financial, interpersonal, professional). Protective factorsfor this patient include: responsibility to others (children, family), coping skills and hope for the future. Considering these factors, the overall suicide risk at this point appears to below. Patientisappropriate for outpatient follow up.  The duration of this appointment visit was 25 minutes of non face-to-face time with the patient.  Greater than 50% of this time was spent in counseling, explanation of  diagnosis, planning of further management, and coordination of care.   Neysa Hottereina Cing , MD 11/29/2018, 4:51 PM

## 2018-11-29 ENCOUNTER — Encounter (HOSPITAL_COMMUNITY): Payer: Self-pay | Admitting: Psychiatry

## 2018-11-29 ENCOUNTER — Other Ambulatory Visit: Payer: Self-pay

## 2018-11-29 ENCOUNTER — Ambulatory Visit (INDEPENDENT_AMBULATORY_CARE_PROVIDER_SITE_OTHER): Payer: 59 | Admitting: Psychiatry

## 2018-11-29 DIAGNOSIS — F331 Major depressive disorder, recurrent, moderate: Secondary | ICD-10-CM

## 2018-11-29 NOTE — Patient Instructions (Signed)
1. Continue lexapro 20 mg daily  2. Continue Trazodone50 mg at night as needed for sleep 3. Next appointment: 6/25 at 3 PM

## 2018-12-24 NOTE — Progress Notes (Addendum)
Virtual Visit via Video Note  I connected with Troy Mahoney on 12/27/18 at  3:00 PM EDT by a video enabled telemedicine application and verified that I am speaking with the correct person using two identifiers.   I discussed the limitations of evaluation and management by telemedicine and the availability of in person appointments. The patient expressed understanding and agreed to proceed.      I discussed the assessment and treatment plan with the patient. The patient was provided an opportunity to ask questions and all were answered. The patient agreed with the plan and demonstrated an understanding of the instructions.   The patient was advised to call back or seek an in-person evaluation if the symptoms worsen or if the condition fails to improve as anticipated.  I provided 25 minutes of non-face-to-face time during this encounter.   Norman Clay, MD    [facility] MD/PA/NP OP Progress Note  12/27/2018 3:31 PM MD SMOLA  MRN:  [id number]  Chief Complaint:  Chief Complaint    Depression; Follow-up     HPI:  This is a follow-up appointment for depression.   He states that he has been doing well.  He manages things well at work; he was told by his supervisor that he was extremely professional on the phone with customers. He feels great about this. He currently work remotely, and is returning to do full time in August.  He took his off work as he did not want to lose his job.  He believes that his supervisor has been helping him so that he does not get distracted by other people.  The relationship with his mother is "shallow." Although it is not what he wants, he continue this way as he does not like any confrontation. He notices that he tends to feel more anxious in public or when he is around with people. He attributes this to pandemic, and because he is self conscious about his appearance. He is willing to try group therapy.  He sleeps better.  He feels fatigue; although he has  been trying to find something to enjoy such as work out, he has no energy to do it. He agrees to try daily walk.  He has fair concentration.  He denies SI.  Denies panic attacks.  He has less irritability. He takes lexapro every day.   Visit Diagnosis:    ICD-10-CM   1. MDD (major depressive disorder), recurrent episode, moderate ([place])  F33.1     Past Psychiatric History: Please see initial evaluation for full details. I have reviewed the history. No updates at this time.     Past Medical History: No past medical history on file. No past surgical history on file.  Family Psychiatric History: Please see initial evaluation for full details. I have reviewed the history. No updates at this time.     Family History:  Family History  Problem Relation Age of Onset  . Anxiety disorder Mother   . Depression Mother   . Anxiety disorder Maternal Aunt   . Anxiety disorder Maternal Grandmother     Social History:  Social History   Socioeconomic History  . Marital status: Single    Spouse name: Not on file  . Number of children: Not on file  . Years of education: Not on file  . Highest education level: Not on file  Occupational History  . Not on file  Social Needs  . Financial resource strain: Not on file  . Food insecurity  Worry: Not on file    Inability: Not on file  . Transportation needs    Medical: Not on file    Non-medical: Not on file  Tobacco Use  . Smoking status: Never Smoker  . Smokeless tobacco: Never Used  Substance and Sexual Activity  . Alcohol use: Yes    Comment: occ.  . Drug use: No  . Sexual activity: Not on file  Lifestyle  . Physical activity    Days per week: Not on file    Minutes per session: Not on file  . Stress: Not on file  Relationships  . Social Musicianconnections    Talks on phone: Not on file    Gets together: Not on file    Attends religious service: Not on file    Active member of club or organization: Not on file    Attends meetings of  clubs or organizations: Not on file    Relationship status: Not on file  Other Topics Concern  . Not on file  Social History Narrative  . Not on file    Allergies: No Known Allergies  Metabolic Disorder Labs: No results found for: HGBA1C, MPG No results found for: PROLACTIN No results found for: CHOL, TRIG, HDL, CHOLHDL, VLDL, LDLCALC Lab Results  Component Value Date   TSH 0.69 08/28/2018    Therapeutic Level Labs: No results found for: LITHIUM No results found for: VALPROATE No components found for:  CBMZ  Current Medications: Current Outpatient Medications  Medication Sig Dispense Refill  . amLODipine (NORVASC) 5 MG tablet TAKE 1 TABLET BY MOUTH DAILY WITH OLMERSARTAN    . [START ON 01/28/2019] escitalopram (LEXAPRO) 20 MG tablet Take 1 tablet (20 mg total) by mouth daily. 90 tablet 0  . hydrochlorothiazide (MICROZIDE) 12.5 MG capsule Take 12.5 mg by mouth daily.    Marland Kitchen. ibuprofen (ADVIL,MOTRIN) 200 MG tablet Take 200 mg by mouth every 6 (six) hours as needed for mild pain or moderate pain.    Marland Kitchen. olmesartan (BENICAR) 20 MG tablet TAKE 1 TABLET BY MOUTH DAILY FOR BLOOD PRESSURE WITH AMLODIPINE    . [START ON 01/28/2019] traZODone (DESYREL) 50 MG tablet Take 1 tablet (50 mg total) by mouth at bedtime as needed for sleep. 90 tablet 0   No current facility-administered medications for this visit.      Musculoskeletal: Strength & Muscle Tone: N/A Gait & Station: N/A Patient leans: N/A  Psychiatric Specialty Exam: ROS  There were no vitals taken for this visit.There is no height or weight on file to calculate BMI.  General Appearance: Fairly Groomed  Eye Contact:  Good  Speech:  Clear and Coherent  Volume:  Normal  Mood:  Anxious  Affect:  Appropriate, Congruent and calmer  Thought Process:  Coherent  Orientation:  Full (Time, Place, and Person)  Thought Content: Logical   Suicidal Thoughts:  No  Homicidal Thoughts:  No  Memory:  Immediate;   Good  Judgement:  Good   Insight:  Fair  Psychomotor Activity:  Normal  Concentration:  Concentration: Good and Attention Span: Good  Recall:  Good  Fund of Knowledge: Good  Language: Good  Akathisia:  No  Handed:  Right  AIMS (if indicated): not done  Assets:  Communication Skills Desire for Improvement  ADL's:  Intact  Cognition: WNL  Sleep:  Fair   Screenings:   Assessment and Plan:  Troy Mahoney is a 28 y.o. year old male with a history of depression, hypertension, who presents for  follow up appointment for depression.   # MDD, moderate, recurrent without psychotic features Exam is notable for calmer affect, and there has been steady improvement in depressive symptoms and irritability after taking Lexapro consistently at higher dose.  Psychosocial stressors includes grief of loss of his father in March 2017, conflict with his mother, who is on renal transplant.  Will continue Lexapro to target depression.  Will continue trazodone as needed for insomnia. Noted that although he did report history of subthreshold hypomanic symptoms of impulsive shopping of buying [employer], it was more related to manic defense rather than bipolar disorder.   Will continue to monitor.  Discussed behavioral activation.  Discussed experiential avoidance.  He will greatly benefit from IOP; make referral.   Plan 1. Continuelexapro 20 mg daily  2. Continue Trazodone50 mg at night as needed for sleep 3. Referral to IOP 4. Next appointment: 7/28 at 3 PM for 30 mins, video TSH reviewed; wnl0.69 - he is planning to return to work full time on August 1st.  - I would support that he would remain work with restriction; 5hours/day until August 1 to avoid relapse in his symptoms.   Addendum: 6/26. He continues to report intense anxiety when he is around with people, and he is at high risk of relapse in his mood symptoms if he were to return to full time work. He will also plan to enrol in IOP, which would take up to half day. I would  support that he remain work with restriction: 5 hours per day until August 1st. He will be referred to individual therapy after he completes IOP (if he is interested). The first visit with this note writer is 07/2018. He was not seen before by this note Clinical research associatewriter.     Past trials of medication:sertraline (insomnia)  The patient demonstrates the following risk factors for suicide: Chronic risk factors for suicide include:psychiatric disorder ofdepression. Acute risk factorsfor suicide include: loss (financial, interpersonal, professional). Protective factorsfor this patient include: responsibility to others (children, family), coping skills and hope for the future. Considering these factors, the overall suicide risk at this point appears to below. Patientisappropriate for outpatient follow up.  The duration of this appointment visit was 25 minutes of non face-to-face time with the patient.  Greater than 50% of this time was spent in counseling, explanation of  diagnosis, planning of further management, and coordination of care.  Neysa Hottereina Cabrini Ruggieri, MD 12/27/2018, 3:31 PM

## 2018-12-27 ENCOUNTER — Encounter (HOSPITAL_COMMUNITY): Payer: Self-pay | Admitting: Psychiatry

## 2018-12-27 ENCOUNTER — Ambulatory Visit (INDEPENDENT_AMBULATORY_CARE_PROVIDER_SITE_OTHER): Payer: 59 | Admitting: Psychiatry

## 2018-12-27 ENCOUNTER — Other Ambulatory Visit: Payer: Self-pay

## 2018-12-27 DIAGNOSIS — F331 Major depressive disorder, recurrent, moderate: Secondary | ICD-10-CM | POA: Diagnosis not present

## 2018-12-27 MED ORDER — ESCITALOPRAM OXALATE 20 MG PO TABS: 20.0000 mg | ORAL_TABLET | Freq: Every day | ORAL | 0 refills | Status: DC

## 2018-12-27 MED ORDER — TRAZODONE HCL 50 MG PO TABS: 50.0000 mg | ORAL_TABLET | Freq: Every evening | ORAL | 0 refills | Status: DC | PRN

## 2018-12-27 NOTE — Patient Instructions (Signed)
1. Continuelexapro 20 mg daily  2. Continue Trazodone50 mg at night as needed for sleep 3. Referral to IOP (group therapy) 4. Next appointment: 7/28 at 3 PM

## 2018-12-28 ENCOUNTER — Telehealth (HOSPITAL_COMMUNITY): Payer: Self-pay | Admitting: *Deleted

## 2018-12-28 ENCOUNTER — Telehealth (HOSPITAL_COMMUNITY): Payer: Self-pay | Admitting: Psychiatry

## 2018-12-28 NOTE — Telephone Encounter (Signed)
I made addendum to my note yesterday to answer their questions. Could you fax the note to them? Thanks.

## 2018-12-28 NOTE — Telephone Encounter (Signed)
DONE

## 2018-12-28 NOTE — Telephone Encounter (Signed)
DR HISADA VM FROM [facility] [employer] REQUESTING MORE INFORMATION ON CASE # [id number]. THEY ASKED FOR MORE INFORMATION SUPPORTING WHY PATIENT CAN ONLY WORK A 5 HR/DAY SHIFT? THEY SAID THERE ISN'T ENOUGH INFORMATION SUPPORTING A  5 HR/DAY WORK SCHEDULE. ALSO ASKED  IF PATIENT WAS SEEN 04/2017 THRU 06/2017 & IF PATIENT IS STILL SEEING THERAPIST? AND IF SO HOW OFTEN? ASKED FOR A RETURN VM  # [id number] OR A FAX [phone] CASE # [id number].

## 2018-12-28 NOTE — Telephone Encounter (Signed)
DR HISADA VM FROM [facility] [employer] REQUESTING A MORE INFORMATION ON CASE # [id number]. THEY ASKED FOR MORE INFORMATION SUPPORTING WHY PATIENT CAN ONLY WORK 5 HRS/DAY? THEY SAID THEY DON'T HAVE ENOUGH TO SUPPORT A 5 HR/DAY WORK SCHEDULE. ALSO ASKED IF PATIENT

## 2019-01-22 NOTE — Progress Notes (Signed)
Virtual Visit via Video Note  I connected with Troy Mahoney on 01/29/19 at  3:00 PM EDT by a video enabled telemedicine application and verified that I am speaking with the correct person using two identifiers.   I discussed the limitations of evaluation and management by telemedicine and the availability of in person appointments. The patient expressed understanding and agreed to proceed.    I discussed the assessment and treatment plan with the patient. The patient was provided an opportunity to ask questions and all were answered. The patient agreed with the plan and demonstrated an understanding of the instructions.   The patient was advised to call back or seek an in-person evaluation if the symptoms worsen or if the condition fails to improve as anticipated.  I provided 25 minutes of non-face-to-face time during this encounter.   Neysa Hottereina Alexa Golebiewski, MD    [facility] MD/PA/NP OP Progress Note  01/29/2019 3:03 PM Troy Mahoney  MRN:  [id number]  Chief Complaint:  Chief Complaint    Depression; Follow-up     HPI:  This is a follow-up appointment for depression.  He has been feeling a lot better and much happier, compared to the last visit. However, he notices that he has been irritable and easily frustrated with others. He describes an incident when he snapped at his friend and was verbally aggressive, although he denies any physical aggression. 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 He notices that it has become difficult to handle it, and is interested in adjusting his medication.  He also reports recent loss of his paternal uncle.  He states that he has been disconnected with his paternal side of family since his father is deceased as they look alike. He tearfully describes that he does not know how to grieve.  He did not attend funeral as he was overwhelmed.  He sleeps well except one night he had a panic attack  in the middle of the night.  He feels less depressed.  He has fair motivation and energy.  He has difficulty concentration when he feels irritable.  He denies SI.  He feels anxious and tense at times.   Visit Diagnosis:    ICD-10-CM   1. MDD (major depressive disorder), recurrent episode, moderate (HCC)  F33.1     Past Psychiatric History: Please see initial evaluation for full details. I have reviewed the history. No updates at this time.     Past Medical History: History reviewed. No pertinent past medical history. History reviewed. No pertinent surgical history.  Family Psychiatric History: Please see initial evaluation for full details. I have reviewed the history. No updates at this time.     Family History:  Family History  Problem Relation Age of Onset  . Anxiety disorder Mother   . Depression Mother   . Anxiety disorder Maternal Aunt   . Anxiety disorder Maternal Grandmother     Social History:  Social History   Socioeconomic History  . Marital status: Single    Spouse name: Not on file  . Number of children: Not on file  . Years of education: Not on file  . Highest education level: Not on file  Occupational History  . Not on file  Social Needs  . Financial resource strain: Not on file  . Food insecurity    Worry: Not on file    Inability: Not on file  . Transportation needs  Medical: Not on file    Non-medical: Not on file  Tobacco Use  . Smoking status: Never Smoker  . Smokeless tobacco: Never Used  Substance and Sexual Activity  . Alcohol use: Yes    Comment: occ.  . Drug use: No  . Sexual activity: Not on file  Lifestyle  . Physical activity    Days per week: Not on file    Minutes per session: Not on file  . Stress: Not on file  Relationships  . Social Musicianconnections    Talks on phone: Not on file    Gets together: Not on file    Attends religious service: Not on file    Active member of club or organization: Not on file    Attends meetings  of clubs or organizations: Not on file    Relationship status: Not on file  Other Topics Concern  . Not on file  Social History Narrative  . Not on file    Allergies: No Known Allergies  Metabolic Disorder Labs: No results found for: HGBA1C, MPG No results found for: PROLACTIN No results found for: CHOL, TRIG, HDL, CHOLHDL, VLDL, LDLCALC Lab Results  Component Value Date   TSH 0.69 08/28/2018    Therapeutic Level Labs: No results found for: LITHIUM No results found for: VALPROATE No components found for:  CBMZ  Current Medications: Current Outpatient Medications  Medication Sig Dispense Refill  . amLODipine (NORVASC) 5 MG tablet TAKE 1 TABLET BY MOUTH DAILY WITH OLMERSARTAN    . escitalopram (LEXAPRO) 20 MG tablet Take 1 tablet (20 mg total) by mouth daily. 90 tablet 0  . hydrochlorothiazide (MICROZIDE) 12.5 MG capsule Take 12.5 mg by mouth daily.    Marland Kitchen. ibuprofen (ADVIL,MOTRIN) 200 MG tablet Take 200 mg by mouth every 6 (six) hours as needed for mild pain or moderate pain.    Marland Kitchen. olmesartan (BENICAR) 20 MG tablet TAKE 1 TABLET BY MOUTH DAILY FOR BLOOD PRESSURE WITH AMLODIPINE    . traZODone (DESYREL) 50 MG tablet Take 1 tablet (50 mg total) by mouth at bedtime as needed for sleep. 90 tablet 0   No current facility-administered medications for this visit.      Musculoskeletal: Strength & Muscle Tone: N/A Gait & Station: N/A Patient leans: N/A  Psychiatric Specialty Exam: ROS  There were no vitals taken for this visit.There is no height or weight on file to calculate BMI.  General Appearance: Fairly Groomed  Eye Contact:  Good  Speech:  Clear and Coherent  Volume:  Normal  Mood:  "much better"  Affect:  Appropriate, Congruent, Tearful and calmer  Thought Process:  Coherent  Orientation:  Full (Time, Place, and Person)  Thought Content: Logical   Suicidal Thoughts:  No  Homicidal Thoughts:  No  Memory:  Immediate;   Good  Judgement:  Good  Insight:  Fair   Psychomotor Activity:  Normal  Concentration:  Concentration: Good and Attention Span: Good  Recall:  Good  Fund of Knowledge: Good  Language: Good  Akathisia:  No  Handed:  Right  AIMS (if indicated): not done  Assets:  Communication Skills Desire for Improvement  ADL's:  Intact  Cognition: WNL  Sleep:  Fair   Screenings:   Assessment and Plan:  Troy Mahoney is a [age] y.o. year old male with a history of depression, hypertension, who presents for follow up appointment for depression.   # MDD, moderate, recurrent without psychotic features Although there has been steady improvement in depressive  symptoms the last visit, he continues to have intense irritability.  Psychosocial stressors includes grief of loss of his father in March 9563, conflict with his mother, who is on renal transplant. He also recently lost his paternal uncle.  Will uptitrate Lexapro to target depression.  He has no known cardiac condition.  Will continue trazodone as needed for insomnia.  He is now interested in attending individual therapy; he is encouraged to have follow-up with his therapist.    Plan 1. Increaselexapro 30 mg daily  2. Continue Trazodone50 mg at night as needed for sleep 3. Next appointment: 9/10 at 2:20 for 30 mins, video TSH reviewed; wnl0.69 - he is planning to return to work full time on Sept 13 th.  (he is not interested in IOP anymore)  He continues to have irritability, which interferes with his ability to communicate with others at his capacity.  I would support that he remain work with restriction, 5 hours/day until September 14 th to avoid relapse in his symptoms.   Past trials of medication:sertraline (insomnia)  The patient demonstrates the following risk factors for suicide: Chronic risk factors for suicide include:psychiatric disorder ofdepression. Acute risk factorsfor suicide include: loss (financial, interpersonal, professional). Protective  factorsfor this patient include: responsibility to others (children, family), coping skills and hope for the future. Considering these factors, the overall suicide risk at this point appears to below. Patientisappropriate for outpatient follow up.  The duration of this appointment visit was 25 minutes of non face-to-face time with the patient.  Greater than 50% of this time was spent in counseling, explanation of  diagnosis, planning of further management, and coordination of care.  Norman Clay, MD 01/29/2019, 3:03 PM

## 2019-01-29 ENCOUNTER — Ambulatory Visit (INDEPENDENT_AMBULATORY_CARE_PROVIDER_SITE_OTHER): Payer: 59 | Admitting: Psychiatry

## 2019-01-29 ENCOUNTER — Encounter (HOSPITAL_COMMUNITY): Payer: Self-pay | Admitting: Psychiatry

## 2019-01-29 ENCOUNTER — Other Ambulatory Visit: Payer: Self-pay

## 2019-01-29 DIAGNOSIS — F331 Major depressive disorder, recurrent, moderate: Secondary | ICD-10-CM

## 2019-01-29 NOTE — Patient Instructions (Signed)
1.Increaselexapro 30 mg daily  2. Continue Trazodone50 mg at night as needed for sleep 3. Next appointment:9/10 at 2:20

## 2019-02-06 ENCOUNTER — Telehealth (HOSPITAL_COMMUNITY): Payer: Self-pay | Admitting: Psychiatry

## 2019-02-06 NOTE — Telephone Encounter (Signed)
I see a different patient here. What' the patient's MRN?

## 2019-02-07 ENCOUNTER — Telehealth (HOSPITAL_COMMUNITY): Payer: Self-pay | Admitting: *Deleted

## 2019-02-07 NOTE — Telephone Encounter (Signed)
In terms of this peer to peer review, which number to call, and do we have consent form? If not, please obtain the one.

## 2019-02-07 NOTE — Telephone Encounter (Signed)
I WILL HAVE TO F/U WITH DONNA THE MESSAGE WAS LEFT @ THE FRONT OFFICE.

## 2019-02-07 NOTE — Telephone Encounter (Signed)
F/U WITH  Troy Mahoney  FOR PEER TO PEER REVIEW . MESSAGE LEFT @ FRONT OFFICE

## 2019-02-07 NOTE — Telephone Encounter (Signed)
If we are unable to find the phone number, will wait for them to contact us back.

## 2019-02-18 ENCOUNTER — Ambulatory Visit (HOSPITAL_COMMUNITY): Payer: 59 | Admitting: Psychiatry

## 2019-03-01 NOTE — Progress Notes (Signed)
Virtual Visit via Video Note  I connected with Troy Mahoney on 03/14/19 at  2:20 PM EDT by a video enabled telemedicine application and verified that I am speaking with the correct person using two identifiers.   I discussed the limitations of evaluation and management by telemedicine and the availability of in person appointments. The patient expressed understanding and agreed to proceed.     I discussed the assessment and treatment plan with the patient. The patient was provided an opportunity to ask questions and all were answered. The patient agreed with the plan and demonstrated an understanding of the instructions.   The patient was advised to call back or seek an in-person evaluation if the symptoms worsen or if the condition fails to improve as anticipated.  I provided 25 minutes of non-face-to-face time during this encounter.   Neysa Hottereina Evola Hollis, MD    [facility] MD/PA/NP OP Progress Note  03/14/2019 2:59 PM Troy Mahoney  MRN:  [id number]  Chief Complaint:  Chief Complaint    Depression; Follow-up     HPI:  This is a follow-up appointment for depression.  He states that he has been feeling more anxious as his medical accommodation will be expired next Tuesday.  He is to go back full-time, and he does not think he can handle things.  He states that he has been having more panic attacks.  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  He usually tries to take a moment before making a phone call.  He feels exhausted after returning from work.  He sleeps through 7 PM.  He states that the thought of work makes him feel very stressed.  He is also concerned that they are going to find something wrong with the patient.  He also reports financial strain; he had to pay a few thousand dollars and has not been able to afford seeing a therapist.  His mother will have surgery next week and he is concerned about her condition. They take a walk a few times per week.  He has fair sleep.  He  feels depressed.  He feels fatigue.  He has difficulty in concentration.  He denies SI.  He feels anxious and tense.  He reports worsening in irritability; there was a time he hang up the phone at work, although he was not supposed to do it. He believes higher dose of lexapro has been helping the patient.    Visit Diagnosis:    ICD-10-CM   1. MDD (major depressive disorder), recurrent episode, moderate (HCC)  F33.1     Past Psychiatric History: Please see initial evaluation for full details. I have reviewed the history. No updates at this time.     Past Medical History: No past medical history on file. No past surgical history on file.  Family Psychiatric History: Please see initial evaluation for full details. I have reviewed the history. No updates at this time.     Family History:  Family History  Problem Relation Age of Onset  . Anxiety disorder Mother   . Depression Mother   . Anxiety disorder Maternal Aunt   . Anxiety disorder Maternal Grandmother     Social History:  Social History   Socioeconomic History  . Marital status: Single    Spouse name: Not on file  . Number of children: Not on file  . Years of education: Not on file  . Highest education level: Not on file  Occupational History  . Not on file  Social Needs  . Financial resource strain: Not on file  . Food insecurity    Worry: Not on file    Inability: Not on file  . Transportation needs    Medical: Not on file    Non-medical: Not on file  Tobacco Use  . Smoking status: Never Smoker  . Smokeless tobacco: Never Used  Substance and Sexual Activity  . Alcohol use: Yes    Comment: occ.  . Drug use: No  . Sexual activity: Not on file  Lifestyle  . Physical activity    Days per week: Not on file    Minutes per session: Not on file  . Stress: Not on file  Relationships  . Social Herbalist on phone: Not on file    Gets together: Not on file    Attends religious service: Not on file     Active member of club or organization: Not on file    Attends meetings of clubs or organizations: Not on file    Relationship status: Not on file  Other Topics Concern  . Not on file  Social History Narrative  . Not on file    Allergies: No Known Allergies  Metabolic Disorder Labs: No results found for: HGBA1C, MPG No results found for: PROLACTIN No results found for: CHOL, TRIG, HDL, CHOLHDL, VLDL, LDLCALC Lab Results  Component Value Date   TSH 0.69 08/28/2018    Therapeutic Level Labs: No results found for: LITHIUM No results found for: VALPROATE No components found for:  CBMZ  Current Medications: Current Outpatient Medications  Medication Sig Dispense Refill  . amLODipine (NORVASC) 5 MG tablet TAKE 1 TABLET BY MOUTH DAILY WITH OLMERSARTAN    . ARIPiprazole (ABILIFY) 2 MG tablet Take 1 tablet (2 mg total) by mouth daily. 30 tablet 1  . escitalopram (LEXAPRO) 20 MG tablet Take 1 tablet (20 mg total) by mouth daily. 90 tablet 0  . hydrochlorothiazide (MICROZIDE) 12.5 MG capsule Take 12.5 mg by mouth daily.    Marland Kitchen ibuprofen (ADVIL,MOTRIN) 200 MG tablet Take 200 mg by mouth every 6 (six) hours as needed for mild pain or moderate pain.    Marland Kitchen olmesartan (BENICAR) 20 MG tablet TAKE 1 TABLET BY MOUTH DAILY FOR BLOOD PRESSURE WITH AMLODIPINE    . traZODone (DESYREL) 50 MG tablet Take 1 tablet (50 mg total) by mouth at bedtime as needed for sleep. 90 tablet 0   No current facility-administered medications for this visit.      Musculoskeletal: Strength & Muscle Tone: N/A Gait & Station: N/A Patient leans: N/A  Psychiatric Specialty Exam: Review of Systems  Psychiatric/Behavioral: Positive for depression. Negative for hallucinations, memory loss, substance abuse and suicidal ideas. The patient is nervous/anxious. The patient does not have insomnia.   All other systems reviewed and are negative.   There were no vitals taken for this visit.There is no height or weight on file to  calculate BMI.  General Appearance: Fairly Groomed  Eye Contact:  Good  Speech:  Clear and Coherent  Volume:  Normal  Mood:  Anxious  Affect:  Appropriate, Congruent, Restricted and tense  Thought Process:  Coherent  Orientation:  Full (Time, Place, and Person)  Thought Content: Logical   Suicidal Thoughts:  No  Homicidal Thoughts:  No  Memory:  Immediate;   Good  Judgement:  Good  Insight:  Fair  Psychomotor Activity:  Normal  Concentration:  Concentration: Good and Attention Span: Good  Recall:  Good  Fund of Knowledge: Good  Language: Good  Akathisia:  No  Handed:  Right  AIMS (if indicated): not done  Assets:  Communication Skills Desire for Improvement  ADL's:  Intact  Cognition: WNL  Sleep:  Fair   Screenings:   Assessment and Plan:  Troy Mahoney is a 28 y.o. year old male with a history of depression, hypertension, who presents for follow up appointment for MDD (major depressive disorder), recurrent episode, moderate (HCC)  # MDD, moderate, recurrent without psychotic features He reports slight worsening in anxiety and irritability since the last visit.  Psychosocial stressors includes upcoming return to work full-time, and her mother, who will be having surgery, and financial strain.  He also has grief of loss of his father in March 2017.  Will add Abilify as adjunctive treatment for depression.  Discussed potential risk of EPS and metabolic side effect.  Will continue Lexapro to target depression.  Although he has been on higher dose than recommended, he reports good benefit from higher dose and he has no known cardiac condition.  Will continue trazodone as needed for insomnia.  He is encouraged to reconnect with his  therapist when it is feasible.   Plan 1.Continuelexapro 30 mg daily (he declined refill) 2. Start Abilify 2 mg daily  3. Continue Trazodone50 mg at night as needed for sleep (he declined refill) 4. Next appointment:10/27 at 2 PM for 30 mins,  video TSH reviewed; wnl0.69 - he is planning to return to work full time on Sept 13 th.   Past trials of medication:sertraline (insomnia)  The patient demonstrates the following risk factors for suicide: Chronic risk factors for suicide include:psychiatric disorder ofdepression. Acute risk factorsfor suicide include: loss (financial, interpersonal, professional). Protective factorsfor this patient include: responsibility to others (children, family), coping skills and hope for the future. Considering these factors, the overall suicide risk at this point appears to below. Patientisappropriate for outpatient follow up.  The duration of this appointment visit was 25 minutes of non face-to-face time with the patient.  Greater than 50% of this time was spent in counseling, explanation of  diagnosis, planning of further management, and coordination of care.  [name], MD 03/14/2019, 2:59 PM

## 2019-03-14 ENCOUNTER — Telehealth (HOSPITAL_COMMUNITY): Payer: Self-pay | Admitting: *Deleted

## 2019-03-14 ENCOUNTER — Other Ambulatory Visit: Payer: Self-pay

## 2019-03-14 ENCOUNTER — Ambulatory Visit (INDEPENDENT_AMBULATORY_CARE_PROVIDER_SITE_OTHER): Payer: 59 | Admitting: Psychiatry

## 2019-03-14 ENCOUNTER — Encounter (HOSPITAL_COMMUNITY): Payer: Self-pay | Admitting: Psychiatry

## 2019-03-14 DIAGNOSIS — F331 Major depressive disorder, recurrent, moderate: Secondary | ICD-10-CM | POA: Diagnosis not present

## 2019-03-14 MED ORDER — ARIPIPRAZOLE 2 MG PO TABS: 2.0000 mg | ORAL_TABLET | Freq: Every day | ORAL | 1 refills | Status: DC

## 2019-03-14 NOTE — Telephone Encounter (Signed)
[employer]  PRIOR APPROVAL   ARIPIPRAZOLE 2 MG TABLET                         EFFECTIVE: 03/14/2019 THRU 03/13/2020

## 2019-03-14 NOTE — Patient Instructions (Signed)
1.Continuelexapro 30 mg daily  2. Start Abilify 2 mg daily  3. Continue Trazodone50 mg at night as needed for sleep 4. Next appointment:10/27 at 2 PM

## 2019-04-15 ENCOUNTER — Telehealth (HOSPITAL_COMMUNITY): Payer: Self-pay | Admitting: *Deleted

## 2019-04-15 NOTE — Telephone Encounter (Signed)
SPOKE WITH "LOT" & POINTED OUT THE QUESTION # WITH CONCERN. ABD SHE STATED SHE WOULD LET THE CLAIMS EXAMINER KNOW. AND THEN SHE ASKED MORE QUESTIONS & I INFORMED HER THAT THIS INFORMATION HAS BEE FAXED NUMEROUS TIMES  & SHOULD SCANNED OR DOWNLOADED INTO THEIR SYSTEM TO HELP ANSWER ANY FURTHER QUESTION. AND SHE WAS ABLE TO FIND IT SO IT IS IN THEIR SYSTEM

## 2019-04-15 NOTE — Telephone Encounter (Signed)
Please tell them that I do not know about his previous occupation, as I believe he is doing the same job since the initial encounter with me. Please communicate this information to them.

## 2019-04-15 NOTE — Telephone Encounter (Signed)
I'VE REFAXED WORK HISTORY STATUS TO [employer] AGAIN THIS TIME TO A DIFFERENT PERSON THAT I'VE BEEN DOING PHONE TAG WITH FOR THE LAST FEW DAYS (THURS, FRIDAY, & TODAY). TO RECEIVE A CALL FROM SOMEONE ELSE IN [employer] ABOUT [place] CONCERNING QUESTION:: WAS /IS PATIENT DISABLED FROM PREVIOUS OCCUPATION?? YOU ANSWERED I DON'T KNOW. & NOW THEY ARE REQUESTING A CALL FROM YOU TO CLARIFY THE [place] # [phone] >>>> REFERENCE # [id number]

## 2019-04-24 NOTE — Progress Notes (Deleted)
BH MD/PA/NP OP Progress Note  04/24/2019 9:18 AM Troy Mahoney  MRN:  [id number]  Chief Complaint:  HPI: *** Visit Diagnosis: No diagnosis found.  Past Psychiatric History: Please see initial evaluation for full details. I have reviewed the history. No updates at this time.     Past Medical History: No past medical history on file. No past surgical history on file.  Family Psychiatric History: Please see initial evaluation for full details. I have reviewed the history. No updates at this time.     Family History:  Family History  Problem Relation Age of Onset  . Anxiety disorder Mother   . Depression Mother   . Anxiety disorder Maternal Aunt   . Anxiety disorder Maternal Grandmother     Social History:  Social History   Socioeconomic History  . Marital status: Single    Spouse name: Not on file  . Number of children: Not on file  . Years of education: Not on file  . Highest education level: Not on file  Occupational History  . Not on file  Social Needs  . Financial resource strain: Not on file  . Food insecurity    Worry: Not on file    Inability: Not on file  . Transportation needs    Medical: Not on file    Non-medical: Not on file  Tobacco Use  . Smoking status: Never Smoker  . Smokeless tobacco: Never Used  Substance and Sexual Activity  . Alcohol use: Yes    Comment: occ.  . Drug use: No  . Sexual activity: Not on file  Lifestyle  . Physical activity    Days per week: Not on file    Minutes per session: Not on file  . Stress: Not on file  Relationships  . Social Musician on phone: Not on file    Gets together: Not on file    Attends religious service: Not on file    Active member of club or organization: Not on file    Attends meetings of clubs or organizations: Not on file    Relationship status: Not on file  Other Topics Concern  . Not on file  Social History Narrative  . Not on file    Allergies: No Known  Allergies  Metabolic Disorder Labs: No results found for: HGBA1C, MPG No results found for: PROLACTIN No results found for: CHOL, TRIG, HDL, CHOLHDL, VLDL, LDLCALC Lab Results  Component Value Date   TSH 0.69 08/28/2018    Therapeutic Level Labs: No results found for: LITHIUM No results found for: VALPROATE No components found for:  CBMZ  Current Medications: Current Outpatient Medications  Medication Sig Dispense Refill  . amLODipine (NORVASC) 5 MG tablet TAKE 1 TABLET BY MOUTH DAILY WITH OLMERSARTAN    . ARIPiprazole (ABILIFY) 2 MG tablet Take 1 tablet (2 mg total) by mouth daily. 30 tablet 1  . escitalopram (LEXAPRO) 20 MG tablet Take 1 tablet (20 mg total) by mouth daily. 90 tablet 0  . hydrochlorothiazide (MICROZIDE) 12.5 MG capsule Take 12.5 mg by mouth daily.    Marland Kitchen ibuprofen (ADVIL,MOTRIN) 200 MG tablet Take 200 mg by mouth every 6 (six) hours as needed for mild pain or moderate pain.    Marland Kitchen olmesartan (BENICAR) 20 MG tablet TAKE 1 TABLET BY MOUTH DAILY FOR BLOOD PRESSURE WITH AMLODIPINE    . traZODone (DESYREL) 50 MG tablet Take 1 tablet (50 mg total) by mouth at bedtime as needed for sleep.  90 tablet 0   No current facility-administered medications for this visit.      Musculoskeletal: Strength & Muscle Tone: N/A Gait & Station: N/A Patient leans: N/A  Psychiatric Specialty Exam: ROS  There were no vitals taken for this visit.There is no height or weight on file to calculate BMI.  General Appearance: {Appearance:22683}  Eye Contact:  {BHH EYE CONTACT:22684}  Speech:  Clear and Coherent  Volume:  Normal  Mood:  {BHH MOOD:22306}  Affect:  {Affect (PAA):22687}  Thought Process:  Coherent  Orientation:  Full (Time, Place, and Person)  Thought Content: Logical   Suicidal Thoughts:  {ST/HT (PAA):22692}  Homicidal Thoughts:  {ST/HT (PAA):22692}  Memory:  Immediate;   Good  Judgement:  {Judgement (PAA):22694}  Insight:  {Insight (PAA):22695}  Psychomotor Activity:   Normal  Concentration:  Concentration: Good and Attention Span: Good  Recall:  Good  Fund of Knowledge: Good  Language: Good  Akathisia:  No  Handed:  Right  AIMS (if indicated): not done  Assets:  Communication Skills Desire for Improvement  ADL's:  Intact  Cognition: WNL  Sleep:  {BHH GOOD/FAIR/POOR:22877}   Screenings:   Assessment and Plan:  Troy Mahoney is a 28 y.o. year old male with a history of depression, hypertension , who presents for follow up appointment for No diagnosis found.  # MDD, moderate, recurrent without psychotic features   He reports slight worsening in anxiety and irritability since the last visit.  Psychosocial stressors includes upcoming return to work full-time, and her mother, who will be having surgery, and financial strain.  He also has grief of loss of his father in March 2017.  Will add Abilify as adjunctive treatment for depression.  Discussed potential risk of EPS and metabolic side effect.  Will continue Lexapro to target depression.  Although he has been on higher dose than recommended, he reports good benefit from higher dose and he has no known cardiac condition.  Will continue trazodone as needed for insomnia.  He is encouraged to reconnect with his  therapist when it is feasible.   Plan 1.Continuelexapro30 mg daily(he declined refill) 2. Start Abilify 2 mg daily  3.Continue Trazodone50 mg at night as needed for sleep (he declined refill) 4.Next appointment:10/27 at 2 PM for 30 mins, video TSH reviewed; wnl0.69 - he is planning to return to work full time onSept 13 th.   Past trials of medication:sertraline (insomnia)  The patient demonstrates the following risk factors for suicide: Chronic risk factors for suicide include:psychiatric disorder ofdepression. Acute risk factorsfor suicide include: loss (financial, interpersonal, professional). Protective factorsfor this patient include: responsibility to  others (children, family), coping skills and hope for the future. Considering these factors, the overall suicide risk at this point appears to below. Patientisappropriate for outpatient follow up. Troy Clay, MD 04/24/2019, 9:18 AM

## 2019-04-30 ENCOUNTER — Other Ambulatory Visit: Payer: Self-pay

## 2019-04-30 ENCOUNTER — Ambulatory Visit (HOSPITAL_COMMUNITY): Payer: 59 | Admitting: Psychiatry

## 2019-04-30 ENCOUNTER — Telehealth (HOSPITAL_COMMUNITY): Payer: Self-pay | Admitting: Psychiatry

## 2019-04-30 NOTE — Telephone Encounter (Signed)
Sent link for video visit through [employer]. Patient did not sign in. Called the patient  twice for appointment scheduled today. The patient did not answer the phone. Left voice message to contact the office.  

## 2019-07-18 ENCOUNTER — Other Ambulatory Visit: Payer: Self-pay

## 2019-07-19 ENCOUNTER — Other Ambulatory Visit: Payer: Self-pay
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
## 2019-11-22 ENCOUNTER — Encounter (HOSPITAL_COMMUNITY): Payer: Self-pay | Admitting: Psychiatry

## 2019-11-22 ENCOUNTER — Telehealth (INDEPENDENT_AMBULATORY_CARE_PROVIDER_SITE_OTHER): Payer: 59 | Admitting: Psychiatry

## 2019-11-22 ENCOUNTER — Other Ambulatory Visit: Payer: Self-pay

## 2019-11-22 DIAGNOSIS — F330 Major depressive disorder, recurrent, mild: Secondary | ICD-10-CM

## 2019-11-22 MED ORDER — ESCITALOPRAM OXALATE 20 MG PO TABS: ORAL_TABLET | ORAL | 0 refills | Status: DC

## 2019-11-22 NOTE — Patient Instructions (Signed)
1. Start lexapro 10 mg daily for week, then 20 mg daily  2.Next appointment:7/9 at 10 AM

## 2019-11-26 ENCOUNTER — Telehealth (HOSPITAL_COMMUNITY): Payer: Self-pay | Admitting: *Deleted

## 2019-11-26 NOTE — Telephone Encounter (Signed)
Spoke with patient and informed him that provider wants him to complete a release of information form before staff can fax back paperwork for [facility]. Patient stated he completed a release of information for with [employer] and he will call them to see if they can fax office the release. Staff informed patient that completed forms will not be faxed back to [facility] until that release of information form is received. Patient verbalized understanding.

## 2019-11-27 ENCOUNTER — Telehealth (HOSPITAL_COMMUNITY): Payer: Self-pay | Admitting: *Deleted

## 2019-11-27 NOTE — Telephone Encounter (Signed)
Patient came into office to drop off release form that provider requested that he dropped off before his FMLA forms could be faxed to [employer].   FMLA forms were faxed. Will be placed in FMLA folder.

## 2019-12-09 ENCOUNTER — Encounter (HOSPITAL_COMMUNITY): Payer: Self-pay | Admitting: *Deleted

## 2019-12-20 ENCOUNTER — Ambulatory Visit (HOSPITAL_COMMUNITY): Payer: 59 | Admitting: Psychiatry

## 2019-12-20 ENCOUNTER — Ambulatory Visit (INDEPENDENT_AMBULATORY_CARE_PROVIDER_SITE_OTHER): Payer: 59 | Admitting: Clinical

## 2019-12-20 DIAGNOSIS — F331 Major depressive disorder, recurrent, moderate: Secondary | ICD-10-CM | POA: Diagnosis not present

## 2019-12-20 DIAGNOSIS — F419 Anxiety disorder, unspecified: Secondary | ICD-10-CM | POA: Diagnosis not present
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
## 2020-01-01 ENCOUNTER — Telehealth (HOSPITAL_COMMUNITY): Payer: Self-pay | Admitting: Clinical

## 2020-01-01 NOTE — Telephone Encounter (Signed)
Patient called requesting therapy notes from initial appointment with therapist

## 2020-01-02 ENCOUNTER — Other Ambulatory Visit: Payer: Self-pay

## 2020-01-02 ENCOUNTER — Ambulatory Visit (INDEPENDENT_AMBULATORY_CARE_PROVIDER_SITE_OTHER): Payer: 59 | Admitting: Clinical

## 2020-01-02 DIAGNOSIS — F331 Major depressive disorder, recurrent, moderate: Secondary | ICD-10-CM

## 2020-01-02 DIAGNOSIS — F419 Anxiety disorder, unspecified: Secondary | ICD-10-CM | POA: Diagnosis not present
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
## 2020-01-02 NOTE — Progress Notes (Signed)
Virtual Visit via Video Note  I connected with Troy Mahoney on 01/02/20 at 10:00 AM EDT by a video enabled telemedicine application and verified that I am speaking with the correct person using two identifiers.  Location: Patient: Home Provider: Office   I discussed the limitations of evaluation and management by telemedicine and the availability of in person appointments. The patient expressed understanding and agreed to proceed.     THERAPIST PROGRESS NOTE  Session Time: 10:00AM-10:40AM  Participation Level: Active  Behavioral Response: CasualAlertDepressed  Type of Therapy: Individual Therapy  Treatment Goals addressed: Coping  Interventions: CBT, Motivational Interviewing, Solution Focused and Supportive  Summary: Troy Mahoney is a 29 y.o. male who presents with Depression and Anxiety The OPT therapist worked with thepatientfor his OPT treatment. The OPT therapist utilized Motivational Interviewing to assist in creating therapeutic repore. The patient in the session was engaged and work in collaboration giving feedback about his triggers and symptoms over the past few weeksincluding stress around his work situation and difficulty regulating sleep wake cycle. The OPT therapist utilized Cognitive Behavioral Therapy through cognitive restructuring as well as worked with the patient on coping strategies to assist in management of Depression and Anxiety.   Suicidal/Homicidal:Nowithout intent/plan  Therapist Response:The OPT therapist worked with the patient for the patients scheduled session. The patient was engaged in his session and gave feedback in relation to triggers, symptoms, and behavior responses over the pastfewweeks. The OPT therapist worked with the patient utilizing an in session Cognitive Behavioral Therapy exercise. The patient was responsive in the session and verbalized, " Iam going to try to identify and implement positive coping ". The OPT therapist  gave encouragement around the patients work stress. The OPT therapist will continue treatment work with the patient in his next scheduled session  Plan: Return again in 2/3 weeks.  Diagnosis: Axis I: Recurrent moderate major depressive disorder with anxiety    Axis II: No diagnosis   I discussed the assessment and treatment plan with the patient. The patient was provided an opportunity to ask questions and all were answered. The patient agreed with the plan and demonstrated an understanding of the instructions.   The patient was advised to call back or seek an in-person evaluation if the symptoms worsen or if the condition fails to improve as anticipated.  I provided 40 minutes of non-face-to-face time during this encounter. Winfred Burn, LCSW 01/02/2020

## 2020-01-08 ENCOUNTER — Ambulatory Visit (HOSPITAL_COMMUNITY): Payer: 59 | Admitting: Clinical

## 2020-01-10 ENCOUNTER — Encounter (HOSPITAL_COMMUNITY): Payer: Self-pay | Admitting: Psychiatry

## 2020-01-10 ENCOUNTER — Telehealth (INDEPENDENT_AMBULATORY_CARE_PROVIDER_SITE_OTHER): Payer: 59 | Admitting: Psychiatry

## 2020-01-10 ENCOUNTER — Other Ambulatory Visit: Payer: Self-pay

## 2020-01-10 DIAGNOSIS — F331 Major depressive disorder, recurrent, moderate: Secondary | ICD-10-CM | POA: Diagnosis not present

## 2020-01-10 DIAGNOSIS — F419 Anxiety disorder, unspecified: Secondary | ICD-10-CM | POA: Diagnosis not present

## 2020-01-10 MED ORDER — BUPROPION HCL ER (XL) 150 MG PO TB24: 150.0000 mg | ORAL_TABLET | Freq: Every day | ORAL | 1 refills | Status: DC

## 2020-01-10 NOTE — Patient Instructions (Signed)
1. Continue lexapro 20 mg daily  2. Start bupropion 150 mg daily 3. Next appointment:8/20  at 9 AM

## 2020-02-04 ENCOUNTER — Other Ambulatory Visit: Payer: Self-pay

## 2020-02-04 ENCOUNTER — Ambulatory Visit (INDEPENDENT_AMBULATORY_CARE_PROVIDER_SITE_OTHER): Payer: 59 | Admitting: Clinical

## 2020-02-04 DIAGNOSIS — F419 Anxiety disorder, unspecified: Secondary | ICD-10-CM

## 2020-02-04 DIAGNOSIS — F331 Major depressive disorder, recurrent, moderate: Secondary | ICD-10-CM | POA: Diagnosis not present

## 2020-02-04 NOTE — Progress Notes (Signed)
  Virtual Visit via Video Note  I connected withLedonimon W Mahoney on 02/04/20 at 1:00 PM EDT by a video enabled telemedicine application and verified that I am speaking with the correct person using two identifiers.  Location: Patient: Home Provider: Office  I discussed the limitations of evaluation and management by telemedicine and the availability of in person appointments. The patient expressed understanding and agreed to proceed.    THERAPIST PROGRESS NOTE  Session Time: 1:00PM-1:40PM  Participation Level: Active  Behavioral Response: CasualAlertDepressed  Type of Therapy: Individual Therapy  Treatment Goals addressed: Coping  Interventions: CBT, Motivational Interviewing, Solution Focused and Supportive  Summary: Troy Mahoney is a 29 y.o. male who presents with Depression and Anxiety The OPT therapist worked with thepatientfor his OPT treatment. The OPT therapist utilized Motivational Interviewing to assist in creating therapeutic repore. The patient in the session was engaged and work in collaboration giving feedback about his triggers and symptoms over the past few weeksincluding legal involvement, interactions with others, and financial stressors. The OPT therapist utilized Cognitive Behavioral Therapy through cognitive restructuring as well as worked with the patient on coping strategies to assist in management of Depression and Anxiety. The OPT therapist worked with the patient reviewing and encouraging consistency with Medication Management. The patient willingness to stay compliant with his Medication Therapy.  Suicidal/Homicidal:Nowithout intent/plan  Therapist Response:The OPT therapist worked with the patient for the patients scheduled session. The patient was engaged in his session and gave feedback in relation to triggers, symptoms, and behavior responses over the pastfewweeks. The OPT therapist worked with the patient utilizing an in session  Cognitive Behavioral Therapy exercise. The patient was responsive in the session and verbalized, " Fredia Beets been working to change how I communicate with people so that I dont come off as aggressive or looking for sympathy and it has been working well".The OPT therapist gave encouragement around the patients work to increase socialization and communication with others. The OPT therapist will continue treatment work with the patient in his next scheduled session  Plan: Return again in 2/3 weeks.  Diagnosis:      Axis I: Recurrent moderate major depressive disorder with anxiety                          Axis II: No diagnosis   I discussed the assessment and treatment plan with the patient. The patient was provided an opportunity to ask questions and all were answered. The patient agreed with the plan and demonstrated an understanding of the instructions.  The patient was advised to call back or seek an in-person evaluation if the symptoms worsen or if the condition fails to improve as anticipated.  I provided 40 minutes of non-face-to-face time during this encounter.  Winfred Burn, LCSW 02/04/2020

## 2020-02-17 NOTE — Progress Notes (Signed)
Virtual Visit via Video Note  I connected with Troy Mahoney on 02/21/20 at  9:00 AM EDT by a video enabled telemedicine application and verified that I am speaking with the correct person using two identifiers.   I discussed the limitations of evaluation and management by telemedicine and the availability of in person appointments. The patient expressed understanding and agreed to proceed.   I discussed the assessment and treatment plan with the patient. The patient was provided an opportunity to ask questions and all were answered. The patient agreed with the plan and demonstrated an understanding of the instructions.   The patient was advised to call back or seek an in-person evaluation if the symptoms worsen or if the condition fails to improve as anticipated.  Location: patient- home, provider- home office   I provided 24 minutes of non-face-to-face time during this encounter.   [name], MD    [facility] MD/PA/NP OP Progress Note  02/21/2020 9:41 AM Troy Mahoney  MRN:  [id number]  Chief Complaint:  Chief Complaint    Depression; Follow-up     HPI:  This is a follow-up appointment for depression.  He states that although he feels not noticeably better, not worse since last visit. However, he states that he had SI without intent/plan when he was very stressed; he states that he was financially in a bad place, and the bank came to hold his vehicle. He also had SI when he was notified that his distant friend was killed. He is concerned of upcoming his mother's birthday as there will be a family gathering. He is concerned as it reminds him of his father. When he is asked about return to work, he answers "yes and no." Although he is not opposed to work, he is fearful that he might have SI as he did last year. However, after discussing the plan, he is agreeable to at least try working half days. He has insomnia, which he partly attributes to bupropion. He feels fatigue. He feels  depressed. He has difficulty in concentration. He has had increasing in appetite, and has gained weight. He denies SI. He feels anxious and tense. When he is informed of features of borderline personality disorder, he agrees that these traits fits with how he experiences. He is willing to continue to see Mr. Montez Morita for therapy.    234 lbs Wt Readings from Last 3 Encounters:  09/25/18 248 lb (112.5 kg)  08/28/18 251 lb (113.9 kg)  07/26/18 253 lb (114.8 kg)     Visit Diagnosis:    ICD-10-CM   1. Moderate episode of recurrent major depressive disorder (HCC)  F33.1   2. Borderline personality disorder (HCC)  F60.3     Past Psychiatric History: Please see initial evaluation for full details. I have reviewed the history. No updates at this time.     Past Medical History: No past medical history on file. No past surgical history on file.  Family Psychiatric History: Please see initial evaluation for full details. I have reviewed the history. No updates at this time.     Family History:  Family History  Problem Relation Age of Onset  . Anxiety disorder Mother   . Depression Mother   . Anxiety disorder Maternal Aunt   . Anxiety disorder Maternal Grandmother     Social History:  Social History   Socioeconomic History  . Marital status: Single    Spouse name: Not on file  . Number of children: Not on file  .  Years of education: Not on file  . Highest education level: Not on file  Occupational History  . Not on file  Tobacco Use  . Smoking status: Never Smoker  . Smokeless tobacco: Never Used  Substance and Sexual Activity  . Alcohol use: Yes    Comment: occ.  . Drug use: No  . Sexual activity: Not on file  Other Topics Concern  . Not on file  Social History Narrative  . Not on file   Social Determinants of Health   Financial Resource Strain:   . Difficulty of Paying Living Expenses: Not on file  Food Insecurity:   . Worried About Programme researcher, broadcasting/film/video in the Last  Year: Not on file  . Ran Out of Food in the Last Year: Not on file  Transportation Needs:   . Lack of Transportation (Medical): Not on file  . Lack of Transportation (Non-Medical): Not on file  Physical Activity:   . Days of Exercise per Week: Not on file  . Minutes of Exercise per Session: Not on file  Stress:   . Feeling of Stress : Not on file  Social Connections:   . Frequency of Communication with Friends and Family: Not on file  . Frequency of Social Gatherings with Friends and Family: Not on file  . Attends Religious Services: Not on file  . Active Member of Clubs or Organizations: Not on file  . Attends Banker Meetings: Not on file  . Marital Status: Not on file    Allergies: No Known Allergies  Metabolic Disorder Labs: No results found for: HGBA1C, MPG No results found for: PROLACTIN No results found for: CHOL, TRIG, HDL, CHOLHDL, VLDL, LDLCALC Lab Results  Component Value Date   TSH 0.69 08/28/2018    Therapeutic Level Labs: No results found for: LITHIUM No results found for: VALPROATE No components found for:  CBMZ  Current Medications: Current Outpatient Medications  Medication Sig Dispense Refill  . amLODipine (NORVASC) 5 MG tablet TAKE 1 TABLET BY MOUTH DAILY WITH OLMERSARTAN    . buPROPion (WELLBUTRIN XL) 150 MG 24 hr tablet Take 1 tablet (150 mg total) by mouth daily. 30 tablet 1  . escitalopram (LEXAPRO) 20 MG tablet Take 1 tablet (20 mg total) by mouth daily. 90 tablet 0  . hydrochlorothiazide (MICROZIDE) 12.5 MG capsule Take 12.5 mg by mouth daily.    Marland Kitchen ibuprofen (ADVIL,MOTRIN) 200 MG tablet Take 200 mg by mouth every 6 (six) hours as needed for mild pain or moderate pain.    Marland Kitchen lamoTRIgine (LAMICTAL) 25 MG tablet 25 mg daily for two weeks, then 50 mg daily 60 tablet 0  . olmesartan (BENICAR) 20 MG tablet TAKE 1 TABLET BY MOUTH DAILY FOR BLOOD PRESSURE WITH AMLODIPINE    . traZODone (DESYREL) 50 MG tablet Take 1 tablet (50 mg total) by  mouth at bedtime as needed for sleep. 90 tablet 0   No current facility-administered medications for this visit.     Musculoskeletal: Strength & Muscle Tone: N/A Gait & Station: N/A Patient leans: N/A  Psychiatric Specialty Exam: Review of Systems  Psychiatric/Behavioral: Positive for decreased concentration, dysphoric mood and sleep disturbance. Negative for agitation, behavioral problems, confusion, hallucinations, self-injury and suicidal ideas. The patient is nervous/anxious. The patient is not hyperactive.   All other systems reviewed and are negative.   There were no vitals taken for this visit.There is no height or weight on file to calculate BMI.  General Appearance: Fairly Groomed  Eye  Contact:  Good  Speech:  Clear and Coherent  Volume:  Normal  Mood:  Depressed  Affect:  Appropriate, Congruent, Restricted and down  Thought Process:  Coherent  Orientation:  Full (Time, Place, and Person)  Thought Content: Logical   Suicidal Thoughts:  No  Homicidal Thoughts:  No  Memory:  Immediate;   Good  Judgement:  Good  Insight:  Fair  Psychomotor Activity:  Normal  Concentration:  Concentration: Good and Attention Span: Good  Recall:  Good  Fund of Knowledge: Good  Language: Good  Akathisia:  No  Handed:  Right  AIMS (if indicated): not done  Assets:  Communication Skills Desire for Improvement  ADL's:  Intact  Cognition: WNL  Sleep:  Poor   Screenings:   Assessment and Plan:  Troy Mahoney is a 29 y.o. year old male with a history of  depression, hypertension, who presents for follow up appointment for below.   1. Moderate episode of recurrent major depressive disorder (HCC) 2. Borderline personality disorder (HCC) Although he denies significant change in his mood symptoms, he reports worsening in increasing in appetite. Although it is unclear whether it is attributable to worsening in mood symptoms or adverse reaction from bupropion (though this is less  likely), will discontinue bupropion at this time to avoid potential side effect. He accidentally discontinued Lexapro since the last visit; he is agreeable to reinitiate this medication. Will start lamotrigine to target mood dysregulation. Discussed potential risk of Stevens-Johnson syndrome. Provided psychoeducation regarding diagnosis of borderline personality disorder. He is wanting to continue to work with Mr. Aurther Loft to cultivate DBT skills.   Plan I have reviewed and updated plans as below 1.Restart lexapro 20 mg daily 2. Start lamotrigine 25 mg daily for two weeks, then 50 mg daily - start two weeks after starting lexapro 3. Discontinue bupropion 4. Next appointment:9/17 at 10:30, video  I would support that he takes FMLA and return to work on 9/1 given severity of his symptoms, which interferes with his ability to work at his capacity.Would recommend return to work with limitation of working half days to avoid relapse in his mood symptoms.   Past trials of medication:sertraline (insomnia)   The patient demonstrates the following risk factors for suicide: Chronic risk factors for suicide include:psychiatric disorder ofdepression. Acute risk factorsfor suicide include: loss (financial, interpersonal, professional). Protective factorsfor this patient include: responsibility to others (children, family), coping skills and hope for the future. Considering these factors, the overall suicide risk at this point appears to below. He denies gun access at home. Patientisappropriate for outpatient follow up.  [name], MD 02/21/2020, 9:41 AM

## 2020-02-18 ENCOUNTER — Other Ambulatory Visit: Payer: Self-pay

## 2020-02-18 ENCOUNTER — Ambulatory Visit (INDEPENDENT_AMBULATORY_CARE_PROVIDER_SITE_OTHER): Payer: 59 | Admitting: Clinical

## 2020-02-18 DIAGNOSIS — F419 Anxiety disorder, unspecified: Secondary | ICD-10-CM

## 2020-02-18 DIAGNOSIS — F331 Major depressive disorder, recurrent, moderate: Secondary | ICD-10-CM

## 2020-02-18 NOTE — Progress Notes (Signed)
Virtual Visit via Video Note  I connected withLedonimon DEAKON Mahoney 08/17/21at 11:00 AM EDTby a video enabled telemedicine application and verified that I am speaking with the correct person using two identifiers.  Location: Patient:Home Provider:Office  I discussed the limitations of evaluation and management by telemedicine and the availability of in person appointments. The patient expressed understanding and agreed to proceed.    THERAPIST PROGRESS NOTE  Session Time:11:00AM-11:45AM  Participation Level:Active  Behavioral Response:CasualAlertDepressed  Type of Therapy:Individual Therapy  Treatment Goals addressed:Coping  Interventions:CBT, Motivational Interviewing, Solution Focused and Supportive  Summary:Troy W Foyeis a 29 y.o.malewho presents with Depression and AnxietyThe OPT therapist worked with thepatientfor his OPT treatment. The OPT therapist utilized Motivational Interviewing to assist in creating therapeutic repore. The patient in the session was engaged and work in collaboration giving feedback about his triggers and symptoms over the past few weeksincludingsocialization changes the patient wants to make, communication strategies, and emotion control techniques. The OPT therapist utilized Cognitive Behavioral Therapy through cognitive restructuring as well as worked with the patient on coping strategies to assist in management of Depression and Anxiety. The OPT therapist worked with the patient reviewing and encouraging consistency with Medication Management. The patientverbalized his  willingness to stay compliant with his Medication Therapy.  Suicidal/Homicidal:Nowithout intent/plan  Therapist Response:The OPT therapist worked with the patient for the patients scheduled session. The patient was engaged in his session and gave feedback in relation to triggers, symptoms, and behavior responses over the pastfewweeks. The OPT  therapist worked with the patient utilizing an in session Cognitive Behavioral Therapy exercise. The patient was responsive in the session and verbalized, "I realize more than I have a tendency at times to dive right in".The OPT therapist will continue treatment work with the patient in his next scheduled session  Plan: Return again in2/3weeks.  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 The patient was provided an opportunity to ask questions and all were answered. The patient agreed with the plan and demonstrated an understanding of the instructions.  The patient was advised to call back or seek an in-person evaluation if the symptoms worsen or if the condition fails to improve as anticipated.  I provided55minutes of non-face-to-face time during this encounter.  Winfred Burn, LCSW 02/18/2020

## 2020-02-21 ENCOUNTER — Telehealth (INDEPENDENT_AMBULATORY_CARE_PROVIDER_SITE_OTHER): Payer: 59 | Admitting: Psychiatry

## 2020-02-21 ENCOUNTER — Other Ambulatory Visit: Payer: Self-pay

## 2020-02-21 ENCOUNTER — Encounter (HOSPITAL_COMMUNITY): Payer: Self-pay | Admitting: Psychiatry

## 2020-02-21 DIAGNOSIS — F331 Major depressive disorder, recurrent, moderate: Secondary | ICD-10-CM

## 2020-02-21 DIAGNOSIS — F603 Borderline personality disorder: Secondary | ICD-10-CM

## 2020-02-21 MED ORDER — LAMOTRIGINE 25 MG PO TABS: ORAL_TABLET | ORAL | 0 refills | Status: DC

## 2020-02-21 MED ORDER — ESCITALOPRAM OXALATE 20 MG PO TABS: 20.0000 mg | ORAL_TABLET | Freq: Every day | ORAL | 0 refills | Status: DC

## 2020-02-21 NOTE — Patient Instructions (Signed)
1.Restart lexapro 20 mg daily 2. Start lamotrigine 25 mg daily for two weeks, then 50 mg daily - start two weeks after starting lexapro 3. Discontinue bupropion 4. Next appointment:9/17 at 10:30

## 2020-03-11 ENCOUNTER — Other Ambulatory Visit: Payer: Self-pay

## 2020-03-11 ENCOUNTER — Telehealth (HOSPITAL_COMMUNITY): Payer: Self-pay | Admitting: Clinical

## 2020-03-11 ENCOUNTER — Ambulatory Visit (HOSPITAL_COMMUNITY): Payer: 59 | Admitting: Clinical

## 2020-03-11 NOTE — Telephone Encounter (Signed)
The OPT therapist sent txt to session request at 10AM, however, the patient did not respond. The OPT therapist followed up with a call and left a VM for the patient to return call . The patient did not respond and missed the scheduled session

## 2020-03-16 NOTE — Progress Notes (Signed)
Virtual Visit via Video Note  I connected with Troy Mahoney on 03/20/20 at 10:30 AM EDT by a video enabled telemedicine application and verified that I am speaking with the correct person using two identifiers.   I discussed the limitations of evaluation and management by telemedicine and the availability of in person appointments. The patient expressed understanding and agreed to proceed.     I discussed the assessment and treatment plan with the patient. The patient was provided an opportunity to ask questions and all were answered. The patient agreed with the plan and demonstrated an understanding of the instructions.   The patient was advised to call back or seek an in-person evaluation if the symptoms worsen or if the condition fails to improve as anticipated.  Location: patient- home, provider- home office   I provided 25 minutes of non-face-to-face time during this encounter.   [name], MD    [facility] MD/PA/NP OP Progress Note  03/20/2020 11:07 AM Troy Mahoney  MRN:  [id number]  Chief Complaint:  Chief Complaint    Follow-up; Depression     HPI:  This is a follow-up appointment for depression and borderline personality disorder.  Troy Mahoney states that Troy Mahoney returned to work on September 1.  Troy Mahoney works half days.  Troy Mahoney had technical difficulties, and feels exhausted after work.  Troy Mahoney usually sleeps in the bed after returning home .  Troy Mahoney states that Troy Mahoney was very stressed when his estranged sister contacted him to plan for his mother's birthday.  She is 9-year older than him.  She does not take care of his mother.  Troy Mahoney feels responsible for his mother's care, who is on dialysis and has multiple other medical condition.  Troy Mahoney states that Troy Mahoney feels like Troy Mahoney is preparing for the day for his mother, and Troy Mahoney does not know how Troy Mahoney can handle it as she is only one left for him.  Troy Mahoney lost more than 20 people in the past ten years, and all of them raised him. Troy Mahoney also had recent loss of his best friend's  mother, and uncle.  Troy Mahoney has insomnia, which she attributes to diaphoresis at night.  Troy Mahoney feels fatigue.  Troy Mahoney has difficulty in concentration, although it has been improving.  Troy Mahoney has anhedonia.  Troy Mahoney had SI, and Troy Mahoney drove that time, although Troy Mahoney did not attempt anything otherwise. Troy Mahoney denies SI since then. Troy Mahoney feels anxious, tense. Troy Mahoney believes Troy Mahoney is more even since the last visit.   Daily routine: work from home, then stays in the bed Employment: DSNP retention specialist  Household: mother who is on PD Marital status: single Number of children: 0    Visit Diagnosis:    ICD-10-CM   1. Recurrent moderate major depressive disorder with anxiety (HCC)  F33.1    F41.9   2. Borderline personality disorder (HCC)  F60.3     Past Psychiatric History: Please see initial evaluation for full details. I have reviewed the history. No updates at this time.     Past Medical History: No past medical history on file. No past surgical history on file.  Family Psychiatric History: Please see initial evaluation for full details. I have reviewed the history. No updates at this time.     Family History:  Family History  Problem Relation Age of Onset  . Anxiety disorder Mother   . Depression Mother   . Anxiety disorder Maternal Aunt   . Anxiety disorder Maternal Grandmother     Social History:  Social History  Socioeconomic History  . Marital status: Single    Spouse name: Not on file  . Number of children: Not on file  . Years of education: Not on file  . Highest education level: Not on file  Occupational History  . Not on file  Tobacco Use  . Smoking status: Never Smoker  . Smokeless tobacco: Never Used  Substance and Sexual Activity  . Alcohol use: Yes    Comment: occ.  . Drug use: No  . Sexual activity: Not on file  Other Topics Concern  . Not on file  Social History Narrative  . Not on file   Social Determinants of Health   Financial Resource Strain:   . Difficulty of Paying Living  Expenses: Not on file  Food Insecurity:   . Worried About Programme researcher, broadcasting/film/video in the Last Year: Not on file  . Ran Out of Food in the Last Year: Not on file  Transportation Needs:   . Lack of Transportation (Medical): Not on file  . Lack of Transportation (Non-Medical): Not on file  Physical Activity:   . Days of Exercise per Week: Not on file  . Minutes of Exercise per Session: Not on file  Stress:   . Feeling of Stress : Not on file  Social Connections:   . Frequency of Communication with Friends and Family: Not on file  . Frequency of Social Gatherings with Friends and Family: Not on file  . Attends Religious Services: Not on file  . Active Member of Clubs or Organizations: Not on file  . Attends Banker Meetings: Not on file  . Marital Status: Not on file    Allergies: No Known Allergies  Metabolic Disorder Labs: No results found for: HGBA1C, MPG No results found for: PROLACTIN No results found for: CHOL, TRIG, HDL, CHOLHDL, VLDL, LDLCALC Lab Results  Component Value Date   TSH 0.69 08/28/2018    Therapeutic Level Labs: No results found for: LITHIUM No results found for: VALPROATE No components found for:  CBMZ  Current Medications: Current Outpatient Medications  Medication Sig Dispense Refill  . amLODipine (NORVASC) 5 MG tablet TAKE 1 TABLET BY MOUTH DAILY WITH OLMERSARTAN    . buPROPion (WELLBUTRIN XL) 150 MG 24 hr tablet Take 1 tablet (150 mg total) by mouth daily. 30 tablet 1  . escitalopram (LEXAPRO) 20 MG tablet Take 1 tablet (20 mg total) by mouth daily. 90 tablet 0  . hydrochlorothiazide (MICROZIDE) 12.5 MG capsule Take 12.5 mg by mouth daily.    Marland Kitchen ibuprofen (ADVIL,MOTRIN) 200 MG tablet Take 200 mg by mouth every 6 (six) hours as needed for mild pain or moderate pain.    Marland Kitchen lamoTRIgine (LAMICTAL) 100 MG tablet Take 1 tablet (100 mg total) by mouth daily. 30 tablet 1  . olmesartan (BENICAR) 20 MG tablet TAKE 1 TABLET BY MOUTH DAILY FOR BLOOD  PRESSURE WITH AMLODIPINE    . traZODone (DESYREL) 50 MG tablet Take 1 tablet (50 mg total) by mouth at bedtime as needed for sleep. 90 tablet 0   No current facility-administered medications for this visit.     Musculoskeletal: Strength & Muscle Tone: N/A Gait & Station: N/A Patient leans: N/A  Psychiatric Specialty Exam: Review of Systems  Psychiatric/Behavioral: Positive for decreased concentration, dysphoric mood, sleep disturbance and suicidal ideas. Negative for agitation, behavioral problems, confusion, hallucinations and self-injury. The patient is nervous/anxious. The patient is not hyperactive.   All other systems reviewed and are negative.   There  were no vitals taken for this visit.There is no height or weight on file to calculate BMI.  General Appearance: Fairly Groomed  Eye Contact:  Good  Speech:  Clear and Coherent  Volume:  Normal  Mood:  even  Affect:  Appropriate, Congruent and Tearful  Thought Process:  Coherent  Orientation:  Full (Time, Place, and Person)  Thought Content: Logical   Suicidal Thoughts:  No  Homicidal Thoughts:  No  Memory:  Immediate;   Good  Judgement:  Good  Insight:  Fair  Psychomotor Activity:  Normal  Concentration:  Concentration: Good and Attention Span: Good  Recall:  Good  Fund of Knowledge: Good  Language: Good  Akathisia:  No  Handed:  Right  AIMS (if indicated): not done  Assets:  Communication Skills Desire for Improvement  ADL's:  Intact  Cognition: WNL  Sleep:  Poor   Screenings:   Assessment and Plan:  Troy Mahoney is a 29 y.o. year old male with a history of depression, hypertension , who presents for follow up appointment for below.    1. Recurrent moderate major depressive disorder with anxiety (HCC) 2. Borderline personality disorder (HCC) Although there has been overall improvement in his mood symptoms since starting lamotrigine, Troy Mahoney continues to struggle with depressive symptoms in the context of  being a caregiver of her mother, and returning to work half days.  Will do further up titration of lamotrigine to target mood dysregulation.  Discussed potential risk of Stevens-Johnson syndrome.  Noted that although Troy Mahoney reports diaphoresis, it is unclear whether it is attributable to lamotrigine.  Will consider discontinuation of lamotrigine if any worsening in diaphoresis since this up titration.  Will continue Lexapro to target depression and anxiety.  Troy Mahoney will greatly benefit from CBT/DBT; Troy Mahoney sees Mr. Montez Morita for therapy.   Plan I have reviewed and updated plans as below 1.Continue lexapro20 mg daily 2. Increase lamotrigine 100 mg daily - monitor diaphoresis 3. Next appointment:10/22 at 11:20 , video Emergency resources which includes 911, ED, suicide crisis line [phone]) are discussed.   I would support that Troy Mahoney continues to work half days to avoid relapse in his mood symptoms until Nov 1st.   Past trials of medication:sertraline (insomnia)  I have reviewed suicide assessment in detail. No change in the following assessment.   The patient demonstrates the following risk factors for suicide: Chronic risk factors for suicide include:psychiatric disorder ofdepression. Acute risk factorsfor suicide include: loss (financial, interpersonal, professional). Protective factorsfor this patient include: responsibility to others (children, family), coping skills and hope for the future. Considering these factors, the overall suicide risk at this point appears to below.Troy Mahoney denies gun access at home.Patientisappropriate for outpatient follow up.   [name], MD 03/20/2020, 11:07 AM

## 2020-03-18 ENCOUNTER — Other Ambulatory Visit: Payer: Self-pay

## 2020-03-18 ENCOUNTER — Ambulatory Visit (INDEPENDENT_AMBULATORY_CARE_PROVIDER_SITE_OTHER): Payer: 59 | Admitting: Clinical

## 2020-03-18 DIAGNOSIS — F419 Anxiety disorder, unspecified: Secondary | ICD-10-CM

## 2020-03-18 DIAGNOSIS — F331 Major depressive disorder, recurrent, moderate: Secondary | ICD-10-CM | POA: Diagnosis not present

## 2020-03-18 NOTE — Progress Notes (Signed)
  Virtual Visit via Video Note  I connected withLedonimon ALYN Mahoney 09/15/21at 11:00AM EDTby a video enabled telemedicine application and verified that I am speaking with the correct person using two identifiers.  Location: Patient:Home Provider:Office  I discussed the limitations of evaluation and management by telemedicine and the availability of in person appointments. The patient expressed understanding and agreed to proceed.    THERAPIST PROGRESS NOTE  Session Time:9:15AM-9:55AM  Participation Level:Active  Behavioral Response:CasualAlertDepressed  Type of Therapy:Individual Therapy  Treatment Goals addressed:Coping  Interventions:CBT, Motivational Interviewing, Solution Focused and Supportive  Summary:Troy W Foyeis a 29 y.o.malewho presents with Depression and AnxietyThe OPT therapist worked with thepatientfor his OPT treatment. The OPT therapist utilized Motivational Interviewing to assist in creating therapeutic repore. The patient in the session was engaged and work in collaboration giving feedback about his triggers and symptoms over the past few weeksincludingtransitioning back to work and adjusting to the change. The OPT therapist utilized Cognitive Behavioral Therapy through cognitive restructuring as well as worked with the patient on coping strategies to assist in management of Depression and Anxiety.The OPT therapist worked with the patient on strategies to improve regulation of his sleep/wake cycle. The OPT therapist worked with the patient reviewing and encouraging consistency with Medication Management.  Suicidal/Homicidal:Nowithout intent/plan  Therapist Response:The OPT therapist worked with the patient for the patients scheduled session. The patient was engaged in his session and gave feedback in relation to triggers, symptoms, and behavior responses over the pastfewweeks. The OPT therapist worked with the patient  utilizing an in session Cognitive Behavioral Therapy exercise. The patient was responsive in the session and verbalized, " The session has been helpful and gave me a lot to think about I think I can try to make some changes".The OPT therapist will continue treatment work with the patient in his next scheduled session  Plan: Return again in2/3weeks.  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 The patient was provided an opportunity to ask questions and all were answered. The patient agreed with the plan and demonstrated an understanding of the instructions.  The patient was advised to call back or seek an in-person evaluation if the symptoms worsen or if the condition fails to improve as anticipated.  I provided46minutes of non-face-to-face time during this encounter.  Winfred Burn, LCSW 03/18/2020

## 2020-03-20 ENCOUNTER — Other Ambulatory Visit: Payer: Self-pay

## 2020-03-20 ENCOUNTER — Telehealth (INDEPENDENT_AMBULATORY_CARE_PROVIDER_SITE_OTHER): Payer: 59 | Admitting: Psychiatry

## 2020-03-20 ENCOUNTER — Encounter (HOSPITAL_COMMUNITY): Payer: Self-pay | Admitting: Psychiatry

## 2020-03-20 DIAGNOSIS — F419 Anxiety disorder, unspecified: Secondary | ICD-10-CM | POA: Diagnosis not present

## 2020-03-20 DIAGNOSIS — F603 Borderline personality disorder: Secondary | ICD-10-CM

## 2020-03-20 DIAGNOSIS — F331 Major depressive disorder, recurrent, moderate: Secondary | ICD-10-CM

## 2020-03-20 MED ORDER — LAMOTRIGINE 100 MG PO TABS: 100.0000 mg | ORAL_TABLET | Freq: Every day | ORAL | 1 refills | Status: DC

## 2020-03-20 NOTE — Patient Instructions (Signed)
1.Continue lexapro20 mg daily 2. Increase lamotrigine 100 mg daily  3. Next appointment:10/22 at 11:20  CONTACT INFORMATION  What to do if you need to get in touch with someone regarding a psychiatric issue:  1. EMERGENCY: For psychiatric emergencies (if you are suicidal or if there are any other safety issues) call 911 and/or go to your nearest Emergency Room immediately.   2. IF YOU NEED SOMEONE TO TALK TO RIGHT NOW: Given my clinical responsibilities, I may not be able to speak with you over the phone for a prolonged period of time.  A. You may always call The National Suicide Prevention Lifeline at 1-800-273-TALK 5151833049).  B. You may walk in to [facility]  Address: [address] [place], [place] [address], Phone: [phone].  Open 24/7, No appointment required. C. Your county of residence will also have local crisis services. For [facility]: [facility] at [phone] (24 Hour Crisis Hotline)

## 2020-03-31 ENCOUNTER — Other Ambulatory Visit: Payer: Self-pay

## 2020-03-31 ENCOUNTER — Ambulatory Visit (INDEPENDENT_AMBULATORY_CARE_PROVIDER_SITE_OTHER): Payer: 59 | Admitting: Clinical

## 2020-03-31 DIAGNOSIS — F419 Anxiety disorder, unspecified: Secondary | ICD-10-CM

## 2020-03-31 DIAGNOSIS — F331 Major depressive disorder, recurrent, moderate: Secondary | ICD-10-CM
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
## 2020-04-14 ENCOUNTER — Other Ambulatory Visit: Payer: Self-pay

## 2020-04-14 ENCOUNTER — Ambulatory Visit (INDEPENDENT_AMBULATORY_CARE_PROVIDER_SITE_OTHER): Payer: 59 | Admitting: Clinical

## 2020-04-14 DIAGNOSIS — F331 Major depressive disorder, recurrent, moderate: Secondary | ICD-10-CM | POA: Diagnosis not present

## 2020-04-14 DIAGNOSIS — F419 Anxiety disorder, unspecified: Secondary | ICD-10-CM | POA: Diagnosis not present
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
## 2020-04-24 ENCOUNTER — Encounter (HOSPITAL_COMMUNITY): Payer: Self-pay | Admitting: Psychiatry

## 2020-04-24 ENCOUNTER — Telehealth (INDEPENDENT_AMBULATORY_CARE_PROVIDER_SITE_OTHER): Payer: 59 | Admitting: Psychiatry

## 2020-04-24 ENCOUNTER — Other Ambulatory Visit: Payer: Self-pay

## 2020-04-24 DIAGNOSIS — F603 Borderline personality disorder: Secondary | ICD-10-CM

## 2020-04-24 DIAGNOSIS — F419 Anxiety disorder, unspecified: Secondary | ICD-10-CM

## 2020-04-24 DIAGNOSIS — F331 Major depressive disorder, recurrent, moderate: Secondary | ICD-10-CM | POA: Diagnosis not present

## 2020-04-24 MED ORDER — ESCITALOPRAM OXALATE 20 MG PO TABS: 20.0000 mg | ORAL_TABLET | Freq: Every day | ORAL | 0 refills | Status: DC

## 2020-04-24 MED ORDER — LAMOTRIGINE 100 MG PO TABS: 100.0000 mg | ORAL_TABLET | Freq: Every day | ORAL | 1 refills | Status: DC

## 2020-04-24 MED ORDER — ARIPIPRAZOLE 2 MG PO TABS: 2.0000 mg | ORAL_TABLET | Freq: Every day | ORAL | 1 refills | Status: DC

## 2020-04-24 NOTE — Patient Instructions (Signed)
1.Continuelexapro20 mg daily 2. Start Abilify 2 mg daily  3. Continue lamotrigine 100 mg daily  4. Next appointment:12/16 at 11:30

## 2020-04-28 ENCOUNTER — Ambulatory Visit (INDEPENDENT_AMBULATORY_CARE_PROVIDER_SITE_OTHER): Payer: 59 | Admitting: Clinical

## 2020-04-28 ENCOUNTER — Other Ambulatory Visit: Payer: Self-pay

## 2020-04-28 DIAGNOSIS — F419 Anxiety disorder, unspecified: Secondary | ICD-10-CM

## 2020-04-28 DIAGNOSIS — F331 Major depressive disorder, recurrent, moderate: Secondary | ICD-10-CM | POA: Diagnosis not present

## 2020-04-28 NOTE — Progress Notes (Signed)
  Virtual Visit via Video Note  I connected withLedonimon KENYATTA Mahoney 10/26/21at8:00AM EDTby a video enabled telemedicine application and verified that I am speaking with the correct person using two identifiers.  Location: Patient:Home Provider:Office  I discussed the limitations of evaluation and management by telemedicine and the availability of in person appointments. The patient expressed understanding and agreed to proceed.    THERAPIST PROGRESS NOTE  Session Time:8:00AM-8:30AM  Participation Level:Active  Behavioral Response:CasualAlertDepressed  Type of Therapy:Individual Therapy  Treatment Goals addressed:Coping  Interventions:CBT, Motivational Interviewing, Solution Focused and Supportive  Summary:Troy Mahoney a 29 y.o.malewho presents with Depression and AnxietyThe OPT therapist worked with thepatientfor his OPT treatment. The OPT therapist utilized Motivational Interviewing to assist in creating therapeutic repore. The patient in the session was engaged and work in collaboration giving feedback about his triggers and symptoms over the past few weeksincluding ongoing legal situation as the patients court hearing was continued to November, waiting to hear back from HR department in relation to their decision about the patients requirements at work, and adjusting to a recent medication change. The OPT therapist utilized Cognitive Behavioral Therapy through cognitive restructuring as well as worked with the patient on coping strategies to assist in management of Depression and Anxiety.The OPT therapist worked with the patient to problem solve and work on each of his current stressors in the pattern in which they are going to need to be addressed. The patient has recently purchased a new vehicle which he will be picking up later today.The OPT therapist worked with the patient reviewing and encouraging consistency with Medication  Management.  Suicidal/Homicidal:Nowithout intent/plan  Therapist Response:The OPT therapist worked with the patient for the patients scheduled session. The patient was engaged in his session and gave feedback in relation to triggers, symptoms, and behavior responses over the pastfewweeks. The OPT therapist worked with the patient utilizing an in session Cognitive Behavioral Therapy exercise. The patient was responsive in the session and verbalized,"I am still in a hold pattern and waiting to hear from HR and my court hearing was continued, thankfully I have a new car I am going to pick up today". The OPT therapist worked with the patient on postive thinking including giving himself credit for continued work on his stressors and allowing the process time. The OPT therapist will continue treatment work with the patient in his next scheduled session  Plan: Return again in2/3weeks.  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 The patient was provided an opportunity to ask questions and all were answered. The patient agreed with the plan and demonstrated an understanding of the instructions.  The patient was advised to call back or seek an in-person evaluation if the symptoms worsen or if the condition fails to improve as anticipated.  I provided66minutes of non-face-to-face time during this encounter.  Winfred Burn, LCSW 04/28/2020

## 2020-05-19 ENCOUNTER — Other Ambulatory Visit: Payer: Self-pay | Admitting: Psychiatry

## 2020-05-19 ENCOUNTER — Telehealth: Payer: Self-pay

## 2020-05-19 ENCOUNTER — Ambulatory Visit (INDEPENDENT_AMBULATORY_CARE_PROVIDER_SITE_OTHER): Payer: 59 | Admitting: Clinical

## 2020-05-19 ENCOUNTER — Other Ambulatory Visit: Payer: Self-pay

## 2020-05-19 DIAGNOSIS — F331 Major depressive disorder, recurrent, moderate: Secondary | ICD-10-CM | POA: Diagnosis not present

## 2020-05-19 DIAGNOSIS — F419 Anxiety disorder, unspecified: Secondary | ICD-10-CM

## 2020-05-19 MED ORDER — TRAZODONE HCL 50 MG PO TABS: 50.0000 mg | ORAL_TABLET | Freq: Every evening | ORAL | 0 refills | Status: DC | PRN

## 2020-05-19 NOTE — Telephone Encounter (Signed)
Ordered

## 2020-05-19 NOTE — Telephone Encounter (Signed)
pt needs refill on trazodone next appt is  06-18-20

## 2020-05-19 NOTE — Progress Notes (Signed)
Virtual Visit via Video Note  I connected withLedonimon W Foyeon11/16/21at8:00AM EDTby a video enabled telemedicine application and verified that I am speaking with the correct person using two identifiers.  Location: Patient:Home Provider:Office  I discussed the limitations of evaluation and management by telemedicine and the availability of in person appointments. The patient expressed understanding and agreed to proceed.    THERAPIST PROGRESS NOTE  Session Time:8:00AM-8:30AM  Participation Level:Active  Behavioral Response:CasualAlertDepressed  Type of Therapy:Individual Therapy  Treatment Goals addressed:Coping  Interventions:CBT, Motivational Interviewing, Solution Focused and Supportive  Summary:Troy W Foyeis a 29 y.o.malewho presents with Depression and AnxietyThe OPT therapist worked with thepatientfor his OPT treatment. The OPT therapist utilized Motivational Interviewing to assist in creating therapeutic repore. The patient in the session was engaged and work in collaboration giving feedback about his triggers and symptoms over the past few weeksincludingongoing legal situation as the patients court hearing is scheduled for November 17th, the patients disability was approved for long term post his short term disability maxing out on November 12th, the patient did get a new vehicle .The OPT therapist utilized Cognitive Behavioral Therapy through cognitive restructuring as well as worked with the patient on coping strategies to assist in management of Depression and Anxiety.The OPT therapist worked with the patient to problem solve and work on each of his current stressors in the pattern in which they are going to need to be addressed. The patient has recently purchased a new vehicle which he will be picking up later today.The OPT therapist worked with the patient reviewing and encouraging consistency with Medication  Management.  Suicidal/Homicidal:Nowithout intent/plan  Therapist Response:The OPT therapist worked with the patient for the patients scheduled session. The patient was engaged in his session and gave feedback in relation to triggers, symptoms, and behavior responses over the pastfewweeks. The OPT therapist worked with the patient utilizing an in session Cognitive Behavioral Therapy exercise. 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 The patient was provided an opportunity to ask questions and all were answered. The patient agreed with the plan and demonstrated an understanding of the instructions.  The patient was advised to call back or seek an in-person evaluation if the symptoms worsen or if the condition fails to improve as anticipated.  I provided53minutes of non-face-to-face time during this encounter.  Winfred Burn, LCSW 05/19/2020

## 2020-06-09 ENCOUNTER — Other Ambulatory Visit: Payer: Self-pay

## 2020-06-09 ENCOUNTER — Telehealth (HOSPITAL_COMMUNITY): Payer: Self-pay | Admitting: Clinical

## 2020-06-09 ENCOUNTER — Ambulatory Visit (HOSPITAL_COMMUNITY): Payer: 59 | Admitting: Clinical

## 2020-06-09 NOTE — Telephone Encounter (Signed)
The patient did not repond to video link call or VM

## 2020-06-15 NOTE — Progress Notes (Signed)
Virtual Visit via Video Note  I connected with Troy Mahoney on 06/18/20 at 11:30 AM EST by a video enabled telemedicine application and verified that I am speaking with the correct person using two identifiers.  Location: Patient: home Provider: office Persons participated in the visit- patient, provider   I discussed the limitations of evaluation and management by telemedicine and the availability of in person appointments. The patient expressed understanding and agreed to proceed.   I discussed the assessment and treatment plan with the patient. The patient was provided an opportunity to ask questions and all were answered. The patient agreed with the plan and demonstrated an understanding of the instructions.   The patient was advised to call back or seek an in-person evaluation if the symptoms worsen or if the condition fails to improve as anticipated.  I provided 15 minutes of non-face-to-face time during this encounter.   [name], MD    [facility] MD/PA/NP OP Progress Note  06/18/2020 12:01 PM Troy Mahoney  MRN:  [id number]  Chief Complaint:  Chief Complaint    Follow-up; Depression     HPI:  This is a follow-up appointment for depression and borderline personality disorder.  He had 2 losses over the past 2 weeks.  His grandmother, 59 year old passed away.  He was very close with this grand mother, and it has been difficult.  Although he did have SI when he knew about her, it subsided afterwards.  He also talks about loss of his cousin, who was murdered by his stepfather.  He has not returned to work.  He has been trying to get prepared, although he does not know what to do.  He agrees to return to work full-time as long as he is able to have days to be excused from work for a flare up of symptoms.  He has occasional insomnia.  He feels fatigue.  He has occasional difficulty in concentration.  He has mild anhedonia.  He gained 30 pounds over the past 3 months.  He denies  SI.  He feels anxious and tense.  He believes Abilify has been helpful for him to feel stable.    Daily routine: Employment:DSNP retention specialist Household:mother who is on PD Marital status:single Number of children:0  256 lbs Wt Readings from Last 3 Encounters:  09/25/18 248 lb (112.5 kg)  08/28/18 251 lb (113.9 kg)  07/26/18 253 lb (114.8 kg)    Visit Diagnosis:    ICD-10-CM   1. Recurrent moderate major depressive disorder with anxiety (HCC)  F33.1    F41.9   2. Borderline personality disorder (HCC)  F60.3     Past Psychiatric History: Please see initial evaluation for full details. I have reviewed the history. No updates at this time.     Past Medical History: No past medical history on file. No past surgical history on file.  Family Psychiatric History: Please see initial evaluation for full details. I have reviewed the history. No updates at this time.     Family History:  Family History  Problem Relation Age of Onset  . Anxiety disorder Mother   . Depression Mother   . Anxiety disorder Maternal Aunt   . Anxiety disorder Maternal Grandmother     Social History:  Social History   Socioeconomic History  . Marital status: Single    Spouse name: Not on file  . Number of children: Not on file  . Years of education: Not on file  . Highest education level: Not  on file  Occupational History  . Not on file  Tobacco Use  . Smoking status: Never Smoker  . Smokeless tobacco: Never Used  Substance and Sexual Activity  . Alcohol use: Yes    Comment: occ.  . Drug use: No  . Sexual activity: Not on file  Other Topics Concern  . Not on file  Social History Narrative  . Not on file   Social Determinants of Health   Financial Resource Strain: Not on file  Food Insecurity: Not on file  Transportation Needs: Not on file  Physical Activity: Not on file  Stress: Not on file  Social Connections: Not on file    Allergies: No Known  Allergies  Metabolic Disorder Labs: No results found for: HGBA1C, MPG No results found for: PROLACTIN No results found for: CHOL, TRIG, HDL, CHOLHDL, VLDL, LDLCALC Lab Results  Component Value Date   TSH 0.69 08/28/2018    Therapeutic Level Labs: No results found for: LITHIUM No results found for: VALPROATE No components found for:  CBMZ  Current Medications: Current Outpatient Medications  Medication Sig Dispense Refill  . amLODipine (NORVASC) 5 MG tablet TAKE 1 TABLET BY MOUTH DAILY WITH OLMERSARTAN    . ARIPiprazole (ABILIFY) 2 MG tablet Take 1 tablet (2 mg total) by mouth daily. 30 tablet 1  . escitalopram (LEXAPRO) 20 MG tablet Take 1 tablet (20 mg total) by mouth daily. 90 tablet 0  . hydrochlorothiazide (MICROZIDE) 12.5 MG capsule Take 12.5 mg by mouth daily.    Marland Kitchen ibuprofen (ADVIL,MOTRIN) 200 MG tablet Take 200 mg by mouth every 6 (six) hours as needed for mild pain or moderate pain.    Marland Kitchen lamoTRIgine (LAMICTAL) 100 MG tablet Take 1 tablet (100 mg total) by mouth daily. 30 tablet 1  . olmesartan (BENICAR) 20 MG tablet TAKE 1 TABLET BY MOUTH DAILY FOR BLOOD PRESSURE WITH AMLODIPINE    . traZODone (DESYREL) 50 MG tablet Take 1 tablet (50 mg total) by mouth at bedtime as needed for sleep. 90 tablet 0   No current facility-administered medications for this visit.     Musculoskeletal: Strength & Muscle Tone: N/A Gait & Station: N/A Patient leans: N/A  Psychiatric Specialty Exam: Review of Systems  Psychiatric/Behavioral: Positive for dysphoric mood and sleep disturbance. Negative for agitation, behavioral problems, confusion, decreased concentration, hallucinations, self-injury and suicidal ideas. The patient is nervous/anxious. The patient is not hyperactive.   All other systems reviewed and are negative.   There were no vitals taken for this visit.There is no height or weight on file to calculate BMI.  General Appearance: Fairly Groomed  Eye Contact:  Good  Speech:   Clear and Coherent  Volume:  Normal  Mood:  Depressed  Affect:  Appropriate, Congruent and Restricted  Thought Process:  Coherent  Orientation:  Full (Time, Place, and Person)  Thought Content: Logical   Suicidal Thoughts:  No  Homicidal Thoughts:  No  Memory:  Immediate;   Good  Judgement:  Good  Insight:  Fair  Psychomotor Activity:  Normal  Concentration:  Concentration: Good and Attention Span: Good  Recall:  Good  Fund of Knowledge: Good  Language: Good  Akathisia:  No  Handed:  Right  AIMS (if indicated): not done  Assets:  Communication Skills Desire for Improvement  ADL's:  Intact  Cognition: WNL  Sleep:  Fair   Screenings:   Assessment and Plan:  OBERON Troy Mahoney is a 29 y.o. year old male with a history of depression,  hypertension, who presents for follow up appointment for below.   1. Recurrent moderate major depressive disorder with anxiety (HCC) 2. Borderline personality disorder (HCC) He reports depressive symptoms in the context of the loss of his family members.  Other psychosocial stressors includes being a caregiver of his mother and work.  We will discontinue lamotrigine given 8 is likely attributing to his weight gain.  Will continue Lexapro to target depression.  We will continue Abilify as adjunctive treatment for depression.  Discussed potential metabolic side effect and EPS.  He is encouraged to continue to see Mr. Montez Morita for therapy.   Plan I have reviewed and updated plans as below 1.Continuelexapro20 mg daily 2. Continue Abilify 2 mg daily  3.Discontinue lamotrigine 4.Next appointment:2/3 at 11 AM for 30 mins, video  [email or web address] - he rarely takes Trazodone  I would support that he return to work full time with arrangement of excusing from work, five times per month for flare of symptoms, 3 days for each episode.  He is at risk of relapse in his mood symptoms, which significantly interferes with his ability to work at his  capacity such as collaborating with other people, and completing tasks.   Past trials of medication:sertraline (insomnia), bupropion  I have reviewed suicide assessment in detail. No change in the following assessment.   The patient demonstrates the following risk factors for suicide: Chronic risk factors for suicide include:psychiatric disorder ofdepression. Acute risk factorsfor suicide include: loss (financial, interpersonal, professional). Protective factorsfor this patient include: responsibility to others (children, family), coping skills and hope for the future. Considering these factors, the overall suicide risk at this point appears to below.He denies gun access at home.Patientisappropriate for outpatient follow up.       [name], MD 06/18/2020, 12:01 PM

## 2020-06-18 ENCOUNTER — Encounter: Payer: Self-pay | Admitting: Psychiatry

## 2020-06-18 ENCOUNTER — Other Ambulatory Visit: Payer: Self-pay

## 2020-06-18 ENCOUNTER — Telehealth: Payer: Self-pay

## 2020-06-18 ENCOUNTER — Telehealth (INDEPENDENT_AMBULATORY_CARE_PROVIDER_SITE_OTHER): Payer: 59 | Admitting: Psychiatry

## 2020-06-18 ENCOUNTER — Telehealth (HOSPITAL_COMMUNITY): Payer: 59 | Admitting: Psychiatry

## 2020-06-18 DIAGNOSIS — F419 Anxiety disorder, unspecified: Secondary | ICD-10-CM

## 2020-06-18 DIAGNOSIS — F603 Borderline personality disorder: Secondary | ICD-10-CM

## 2020-06-18 DIAGNOSIS — F331 Major depressive disorder, recurrent, moderate: Secondary | ICD-10-CM

## 2020-06-18 NOTE — Telephone Encounter (Signed)
Medication management - Fax back to [facility] forms completed by Dr. Vanetta Shawl and the medical record note of her visit from today, per Dr. Bing Matter request and request signed by pt from [facility]. They had requested all provider records since 04/25/20 to present so sent today as prior to that, patient was last seen on 04/24/20.  Copy of faxed items sent to scan.

## 2020-06-18 NOTE — Patient Instructions (Signed)
1.Continuelexapro20 mg daily 2. Continue Abilify 2 mg daily  3.Discontinue lamotrigine 4.Next appointment:2/3 at 11 AM

## 2020-07-16 ENCOUNTER — Ambulatory Visit (HOSPITAL_COMMUNITY): Payer: 59 | Admitting: Clinical

## 2020-07-16 ENCOUNTER — Other Ambulatory Visit: Payer: Self-pay

## 2020-07-17 ENCOUNTER — Telehealth: Payer: Self-pay

## 2020-07-17 NOTE — Telephone Encounter (Signed)
Medication management - Forms received from [facility] to be completed by provider for patient.  Forms left for provider to review and complete for patient as appropriate.

## 2020-07-17 NOTE — Telephone Encounter (Signed)
Noted, thanks!

## 2020-07-23 NOTE — Telephone Encounter (Signed)
faxed and confirmed the paperwork for [employer] was sent.Marland Kitchen

## 2020-07-30 ENCOUNTER — Ambulatory Visit (INDEPENDENT_AMBULATORY_CARE_PROVIDER_SITE_OTHER): Payer: 59 | Admitting: Clinical

## 2020-07-30 ENCOUNTER — Other Ambulatory Visit: Payer: Self-pay

## 2020-07-30 DIAGNOSIS — F331 Major depressive disorder, recurrent, moderate: Secondary | ICD-10-CM

## 2020-07-30 DIAGNOSIS — F419 Anxiety disorder, unspecified: Secondary | ICD-10-CM

## 2020-07-30 NOTE — Progress Notes (Signed)
  Virtual Visit via Video Note  I connected withLedonimon W Foyeon1/27/22at8:00AM EDTby a video enabled telemedicine application and verified that I am speaking with the correct person using two identifiers.  Location: Patient:Home Provider:Office  I discussed the limitations of evaluation and management by telemedicine and the availability of in person appointments. The patient expressed understanding and agreed to proceed.    THERAPIST PROGRESS NOTE  Session Time:8:00AM-8:30AM  Participation Level:Active  Behavioral Response:CasualAlertDepressed  Type of Therapy:Individual Therapy  Treatment Goals addressed:Coping  Interventions:CBT, Motivational Interviewing, Solution Focused and Supportive  Summary:Troy W Foyeis a 30 y.o.malewho presents with Depression and AnxietyThe OPT therapist worked with thepatientfor his OPT treatment. The OPT therapist utilized Motivational Interviewing to assist in creating therapeutic repore. The patient in the session was engaged and work in collaboration giving feedback about his triggers and symptoms over the past few weeksincluding recovering from recently contracting COVID and returning to work.The OPT therapist utilized Cognitive Behavioral Therapy through cognitive restructuring as well as worked with the patient on coping strategies to assist in management of Depression and Anxiety.The OPT therapist worked with the patient to change the way he is currently thinking about his work and not catastrophising allowing his free time to be consumed with thoughts about having to go back into his next work shift.The OPT therapist worked with the patient reviewing and encouraging consistency with Medication Management.  Suicidal/Homicidal:Nowithout intent/plan  Therapist Response:The OPT therapist worked with the patient for the patients scheduled session. The patient was engaged in his session and gave feedback  in relation to triggers, symptoms, and behavior responses over the pastfewweeks. The OPT therapist worked with the patient utilizing an in session Cognitive Behavioral Therapy exercise.The OPT therapist worked with the patient on postive thinking The patient was responsive in the session and verbalized,"I am going to try to change the way I think about work and not let it keep spilling into my free time".The OPT therapist will continue treatment work with the patient in his next scheduled session  Plan: Return again in2/3weeks.  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 The patient was provided an opportunity to ask questions and all were answered. The patient agreed with the plan and demonstrated an understanding of the instructions.  The patient was advised to call back or seek an in-person evaluation if the symptoms worsen or if the condition fails to improve as anticipated.  I provided52minutes of non-face-to-face time during this encounter  Aurther Loft T.Montez Morita, [place]  07/30/2020

## 2020-08-03 NOTE — Progress Notes (Unsigned)
Virtual Visit via Video Note  I connected with Troy Mahoney on 08/06/20 at 11:00 AM EST by a video enabled telemedicine application and verified that I am speaking with the correct person using two identifiers.  Location: Patient: car Provider: office Persons participated in the visit- patient, provider   I discussed the limitations of evaluation and management by telemedicine and the availability of in person appointments. The patient expressed understanding and agreed to proceed.     I discussed the assessment and treatment plan with the patient. The patient was provided an opportunity to ask questions and all were answered. The patient agreed with the plan and demonstrated an understanding of the instructions.   The patient was advised to call back or seek an in-person evaluation if the symptoms worsen or if the condition fails to improve as anticipated.  I provided 17 minutes of non-face-to-face time during this encounter.   [name], MD    [facility] MD/PA/NP OP Progress Note  08/06/2020 11:48 AM Troy Mahoney  MRN:  [id number]  Chief Complaint:  Chief Complaint    Follow-up; Depression     HPI:  This is a follow-up appointment for depression.  He states that he is not doing well.  He feels more emotional since he started to do journal.  He does not know how to make himself happy. Although he feels happy when he does shopping, it is only temporarily.  He wishes to start a new life.  He does not think he can handle relationship as he is "too unstable personality."  He does not want to put stress on other, and he would like to work on himself as he is "not the best me." He feels stressed as he has a lot of responsibility; he needs to take care of his mother, and is also financially responsible.  He states that the work is "complete disaster," although he is trying her best.  He is working 10 hours a day for a week, followed by 9 hours a day.  It is a mental work.  He is only  allowed to take 2 days/month, which he does not think is enough.  He feels burnout, pressured and exhausted.  He is actively pursuing employment at another place.  It is also difficult to be completely off from work as he works from home.  He has insomnia.  He has difficulty in concentration.  He has not been able to do multitask compared to before. He always needs to force himself to do things.  He has fleeting SI; he had SI with plan to jump off from the window into the traffic while his mother was driving 2 weeks ago.  He cried afterwards, and denies any act.  He denies gun access at home.  He is comfortable to contact emergency resources if any worsening in his SI.  He thinks he lost weight since the last visit, although he has not measured his weight.   Daily routine: Employment:DSNP retention specialist Household:mother who is on PD Marital status:single Number of children:0  Was 256 lbs    Wt Readings from Last 3 Encounters:  09/25/18 248 lb (112.5 kg)  08/28/18 251 lb (113.9 kg)  07/26/18 253 lb (114.8 kg)    Visit Diagnosis:    ICD-10-CM   1. Recurrent moderate major depressive disorder with anxiety (HCC)  F33.1    F41.9   2. Borderline personality disorder (HCC)  F60.3     Past Psychiatric History: Please see initial  evaluation for full details. I have reviewed the history. No updates at this time.     Past Medical History: History reviewed. No pertinent past medical history. History reviewed. No pertinent surgical history.  Family Psychiatric History: Please see initial evaluation for full details. I have reviewed the history. No updates at this time.     Family History:  Family History  Problem Relation Age of Onset  . Anxiety disorder Mother   . Depression Mother   . Anxiety disorder Maternal Aunt   . Anxiety disorder Maternal Grandmother     Social History:  Social History   Socioeconomic History  . Marital status: Single    Spouse name: Not on  file  . Number of children: Not on file  . Years of education: Not on file  . Highest education level: Not on file  Occupational History  . Not on file  Tobacco Use  . Smoking status: Never Smoker  . Smokeless tobacco: Never Used  Substance and Sexual Activity  . Alcohol use: Yes    Comment: occ.  . Drug use: No  . Sexual activity: Not on file  Other Topics Concern  . Not on file  Social History Narrative  . Not on file   Social Determinants of Health   Financial Resource Strain: Not on file  Food Insecurity: Not on file  Transportation Needs: Not on file  Physical Activity: Not on file  Stress: Not on file  Social Connections: Not on file    Allergies: No Known Allergies  Metabolic Disorder Labs: No results found for: HGBA1C, MPG No results found for: PROLACTIN No results found for: CHOL, TRIG, HDL, CHOLHDL, VLDL, LDLCALC Lab Results  Component Value Date   TSH 0.69 08/28/2018    Therapeutic Level Labs: No results found for: LITHIUM No results found for: VALPROATE No components found for:  CBMZ  Current Medications: Current Outpatient Medications  Medication Sig Dispense Refill  . ARIPiprazole (ABILIFY) 5 MG tablet Take 1 tablet (5 mg total) by mouth daily. 30 tablet 1  . amLODipine (NORVASC) 5 MG tablet TAKE 1 TABLET BY MOUTH DAILY WITH OLMERSARTAN    . ARIPiprazole (ABILIFY) 2 MG tablet Take 1 tablet (2 mg total) by mouth daily. 30 tablet 1  . [START ON 08/22/2020] escitalopram (LEXAPRO) 20 MG tablet Take 1 tablet (20 mg total) by mouth daily. 90 tablet 0  . hydrochlorothiazide (MICROZIDE) 12.5 MG capsule Take 12.5 mg by mouth daily.    Marland Kitchen ibuprofen (ADVIL,MOTRIN) 200 MG tablet Take 200 mg by mouth every 6 (six) hours as needed for mild pain or moderate pain.    Marland Kitchen olmesartan (BENICAR) 20 MG tablet TAKE 1 TABLET BY MOUTH DAILY FOR BLOOD PRESSURE WITH AMLODIPINE    . traZODone (DESYREL) 50 MG tablet Take 1 tablet (50 mg total) by mouth at bedtime as needed for  sleep. 90 tablet 0   No current facility-administered medications for this visit.     Musculoskeletal: Strength & Muscle Tone: N/A Gait & Station: N/A Patient leans: N/A  Psychiatric Specialty Exam: Review of Systems  Psychiatric/Behavioral: Positive for decreased concentration, dysphoric mood, sleep disturbance and suicidal ideas. Negative for agitation, behavioral problems, confusion, hallucinations and self-injury. The patient is nervous/anxious. The patient is not hyperactive.   All other systems reviewed and are negative.   There were no vitals taken for this visit.There is no height or weight on file to calculate BMI.  General Appearance: Fairly Groomed  Eye Contact:  Good  Speech:  Clear and Coherent  Volume:  Normal  Mood:  Depressed  Affect:  Appropriate, Congruent and down  Thought Process:  Coherent  Orientation:  Full (Time, Place, and Person)  Thought Content: Logical   Suicidal Thoughts:  No  Homicidal Thoughts:  No  Memory:  Immediate;   Good  Judgement:  Good  Insight:  Good  Psychomotor Activity:  Normal  Concentration:  Concentration: Good and Attention Span: Good  Recall:  Good  Fund of Knowledge: Good  Language: Good  Akathisia:  No  Handed:  Right  AIMS (if indicated): not done  Assets:  Communication Skills Desire for Improvement  ADL's:  Intact  Cognition: WNL  Sleep:  Poor   Screenings:   Assessment and Plan:  CEDRIC MCCLAINE is a 30 y.o. year old male with a history of depression, hypertension , who presents for follow up appointment for below.    1. Recurrent moderate major depressive disorder with anxiety (HCC) 2. Borderline personality disorder (HCC) He continues to report depressive symptoms with SI since the last visit.  Psychosocial stressors includes work, and being a caregiver of his mother.  He reports weight loss since he discontinued lamotrigine, although he has not measured his weight.  Will uptitrate Abilify as adjunctive  treatment for depression and also to target mood dysregulation.  Discussed potential metabolic side effect and EPS.  Will continue Lexapro to target depression.  He will continue to see Mr. Montez Morita for therapy.  He denies that any paperwork need to be filled this time.    Plan I have reviewed and updated plans as below 1.Continuelexapro20 mg daily 2. Increase Abilify 5 mg daily  3.Next appointment: 3/17 at 11 AM for 30 mins, video  [email or web address] - he rarely takes Trazodone - Emergency resources which includes 911, ED, suicide crisis line [phone]) are discussed.   Past trials of medication:sertraline (insomnia), bupropion  I have reviewed suicide assessment in detail. No change in the following assessment.   The patient demonstrates the following risk factors for suicide: Chronic risk factors for suicide include:psychiatric disorder ofdepression. Acute risk factorsfor suicide include: loss (financial, interpersonal, professional). Protective factorsfor this patient include: responsibility to others (children, family), coping skills and hope for the future. Considering these factors, the overall suicide risk at this point appears to below.He denies gun access at home.Patientisappropriate for outpatient follow up.  [name], MD 08/06/2020, 11:48 AM

## 2020-08-06 ENCOUNTER — Encounter: Payer: Self-pay | Admitting: Psychiatry

## 2020-08-06 ENCOUNTER — Telehealth (INDEPENDENT_AMBULATORY_CARE_PROVIDER_SITE_OTHER): Payer: 59 | Admitting: Psychiatry

## 2020-08-06 ENCOUNTER — Other Ambulatory Visit: Payer: Self-pay

## 2020-08-06 DIAGNOSIS — F603 Borderline personality disorder: Secondary | ICD-10-CM | POA: Diagnosis not present

## 2020-08-06 DIAGNOSIS — F419 Anxiety disorder, unspecified: Secondary | ICD-10-CM | POA: Diagnosis not present

## 2020-08-06 DIAGNOSIS — F331 Major depressive disorder, recurrent, moderate: Secondary | ICD-10-CM | POA: Diagnosis not present

## 2020-08-06 MED ORDER — ARIPIPRAZOLE 5 MG PO TABS: 5.0000 mg | ORAL_TABLET | Freq: Every day | ORAL | 1 refills | Status: DC

## 2020-08-06 MED ORDER — ESCITALOPRAM OXALATE 20 MG PO TABS: 20.0000 mg | ORAL_TABLET | Freq: Every day | ORAL | 0 refills | Status: DC

## 2020-08-06 NOTE — Patient Instructions (Addendum)
1.Continuelexapro20 mg daily 2. Increase Abilify 5 mg daily  3.Next appointment: 3/17 at 11 AM   CONTACT INFORMATION  What to do if you need to get in touch with someone regarding a psychiatric issue:  1. EMERGENCY: For psychiatric emergencies (if you are suicidal or if there are any other safety issues) call 911 and/or go to your nearest Emergency Room immediately.   2. IF YOU NEED SOMEONE TO TALK TO RIGHT NOW: Given my clinical responsibilities, I may not be able to speak with you over the phone for a prolonged period of time.  A. You may always call The National Suicide Prevention Lifeline at 1-800-273-TALK 772-780-0859).  B. You may walk in to [facility]  Address: [address] [place], [place] [address], Phone: [phone].  Open 24/7, No appointment required. C. Your county of residence will also have local crisis services. For [facility]: [facility] at [phone] (24 Hour Crisis Hotline)

## 2020-08-27 ENCOUNTER — Encounter (HOSPITAL_COMMUNITY): Payer: Self-pay

## 2020-08-27 ENCOUNTER — Ambulatory Visit (INDEPENDENT_AMBULATORY_CARE_PROVIDER_SITE_OTHER): Payer: 59 | Admitting: Clinical

## 2020-08-27 ENCOUNTER — Other Ambulatory Visit: Payer: Self-pay

## 2020-08-27 DIAGNOSIS — F419 Anxiety disorder, unspecified: Secondary | ICD-10-CM

## 2020-08-27 DIAGNOSIS — F331 Major depressive disorder, recurrent, moderate: Secondary | ICD-10-CM | POA: Diagnosis not present
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
## 2020-09-17 ENCOUNTER — Telehealth (INDEPENDENT_AMBULATORY_CARE_PROVIDER_SITE_OTHER): Payer: 59 | Admitting: Psychiatry

## 2020-09-17 ENCOUNTER — Encounter: Payer: Self-pay | Admitting: Psychiatry

## 2020-09-17 ENCOUNTER — Other Ambulatory Visit: Payer: Self-pay

## 2020-09-17 DIAGNOSIS — F603 Borderline personality disorder: Secondary | ICD-10-CM | POA: Diagnosis not present

## 2020-09-17 DIAGNOSIS — F419 Anxiety disorder, unspecified: Secondary | ICD-10-CM

## 2020-09-17 DIAGNOSIS — F331 Major depressive disorder, recurrent, moderate: Secondary | ICD-10-CM | POA: Diagnosis not present

## 2020-09-17 DIAGNOSIS — G4700 Insomnia, unspecified: Secondary | ICD-10-CM | POA: Diagnosis not present

## 2020-09-17 MED ORDER — ARIPIPRAZOLE 10 MG PO TABS: 10.0000 mg | ORAL_TABLET | Freq: Every day | ORAL | 1 refills | Status: DC

## 2020-09-17 NOTE — Patient Instructions (Signed)
1.Continuelexapro20 mg daily 2.Increase Abilify 10 mg daily  3.Next appointment: 5/3 at 9 AM

## 2020-09-21 ENCOUNTER — Ambulatory Visit (INDEPENDENT_AMBULATORY_CARE_PROVIDER_SITE_OTHER): Payer: 59 | Admitting: Clinical

## 2020-09-21 ENCOUNTER — Other Ambulatory Visit: Payer: Self-pay

## 2020-09-21 DIAGNOSIS — F331 Major depressive disorder, recurrent, moderate: Secondary | ICD-10-CM | POA: Diagnosis not present

## 2020-09-21 DIAGNOSIS — F419 Anxiety disorder, unspecified: Secondary | ICD-10-CM | POA: Diagnosis not present

## 2020-09-21 NOTE — Progress Notes (Signed)
Virtual Visit via Telephone Note  I connected with Troy Mahoney on 09/21/20 at  8:00 AM EDT by telephone and verified that I am speaking with the correct person using two identifiers.  Location: Patient: Home Provider: Office   I discussed the limitations, risks, security and privacy concerns of performing an evaluation and management service by telephone and the availability of in person appointments. I also discussed with the patient that there may be a patient responsible charge related to this service. The patient expressed understanding and agreed to proceed.  THERAPIST PROGRESS NOTE  Session Time:8:00AM-8:45AM  Participation Level:Active  Behavioral Response:CasualAlertDepressed  Type of Therapy:Individual Therapy  Treatment Goals addressed:Coping  Interventions:CBT, Motivational Interviewing, Solution Focused and Supportive  Summary:Dadrian W Foyeis a 30 y.o.malewho presents with Depression and AnxietyThe OPT therapist worked with thepatientfor his OPT treatment. The OPT therapist utilized Motivational Interviewing to assist in creating therapeutic repore. The patient in the session was engaged and work in collaboration giving feedback about his triggers and symptoms over the past few weeksincludingongoing duress around being burnt out with his job and making a decision around potentially taking another leave of absence.The OPT therapist utilized Cognitive Behavioral Therapy through cognitive restructuring as well as worked with the patient on coping strategies to assist in management of Depression and Anxiety.The OPT therapist worked with the patient toexamine options/ steps to help him manage current stressors and use decision making steps to assist in making decisions. The OPT therapist placed emphasis on balancing work and leisure/ self care.  Suicidal/Homicidal:Nowithout intent/plan  Therapist Response:The OPT therapist worked with the  patient for the patients scheduled session. The patient was engaged in his session and gave feedback in relation to triggers, symptoms, and behavior responses over the pastfewweeks. The OPT therapist worked with the patient utilizing an in session Cognitive Behavioral Therapy exercise.The OPT therapist worked with the patient on postive thinking The patient was responsive in the session and verbalized,"Italk with HR and take a look at all my options and get information that will help me make a informed decison".The OPT therapist will continue treatment work with the patient in his next scheduled session  Plan: Return again in2/3weeks.  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 The patient was provided an opportunity to ask questions and all were answered. The patient agreed with the plan and demonstrated an understanding of the instructions.  The patient was advised to call back or seek an in-person evaluation if the symptoms worsen or if the condition fails to improve as anticipated.  I provided65minutes of non-face-to-face time during this encounter  Aurther Loft T.Montez Morita, [place]  09/21/2020

## 2020-09-29 ENCOUNTER — Telehealth: Payer: Self-pay

## 2020-09-29 NOTE — Telephone Encounter (Signed)
Please contact him and advise to have sooner follow up to discuss this.

## 2020-09-29 NOTE — Telephone Encounter (Signed)
pt states that he wanted to know if you would do a FLMA he states he was at work and had an emotional breakdown and they told him to go home and to do FLMA Leave

## 2020-10-02 NOTE — Telephone Encounter (Signed)
This was done.

## 2020-10-05 ENCOUNTER — Telehealth (INDEPENDENT_AMBULATORY_CARE_PROVIDER_SITE_OTHER): Payer: 59 | Admitting: Psychiatry

## 2020-10-05 ENCOUNTER — Encounter: Payer: Self-pay | Admitting: Psychiatry

## 2020-10-05 ENCOUNTER — Other Ambulatory Visit: Payer: Self-pay

## 2020-10-05 DIAGNOSIS — F419 Anxiety disorder, unspecified: Secondary | ICD-10-CM

## 2020-10-05 DIAGNOSIS — F603 Borderline personality disorder: Secondary | ICD-10-CM | POA: Diagnosis not present

## 2020-10-05 DIAGNOSIS — F331 Major depressive disorder, recurrent, moderate: Secondary | ICD-10-CM

## 2020-10-05 MED ORDER — ARIPIPRAZOLE 15 MG PO TABS: 15.0000 mg | ORAL_TABLET | Freq: Every day | ORAL | 0 refills | Status: DC

## 2020-10-05 NOTE — Progress Notes (Signed)
Virtual Visit via Video Note  I connected with Troy Mahoney on 10/05/20 at 11:30 AM EDT by a video enabled telemedicine application and verified that I am speaking with the correct person using two identifiers.  Location: Patient: home Provider: office Persons participated in the visit- patient, provider   I discussed the limitations of evaluation and management by telemedicine and the availability of in person appointments. The patient expressed understanding and agreed to proceed.    I discussed the assessment and treatment plan with the patient. The patient was provided an opportunity to ask questions and all were answered. The patient agreed with the plan and demonstrated an understanding of the instructions.   The patient was advised to call back or seek an in-person evaluation if the symptoms worsen or if the condition fails to improve as anticipated.  I provided 22 minutes of non-face-to-face time during this encounter.   [name], MD     [facility] MD/PA/NP OP Progress Note  10/05/2020 12:00 PM Troy Mahoney  MRN:  [id number]  Chief Complaint:  Chief Complaint    Depression; Follow-up     HPI:  This is a follow-up appointment for depression and borderline personality disorder.  This appointment was made sooner due to patient request to discuss FMLA.  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  He states that he keeps burned out at work, and was advised to see her counselor at work.  Although he identified stress at work, there is a constant dilemma in regards to whether or not he keeps his current job.  He "do not know what to do "to change his jobs.  He is also concerned as the job gives him good self-esteem.  He also "exploded my emotion "when he had conversation with his sister.  He is hoping to get back to school to finish college for business administration.  He will likely have another 1-1/2-year to complete this.  He may also  be interested in becoming an insurance agent in the future. He has difficulty in processing information.  He notices that Abilify has been helping some.  He is interested in doing further up titration. Although he reports passive SI, he denies any intent or plans.   Visit Diagnosis:    ICD-10-CM   1. Recurrent moderate major depressive disorder with anxiety (HCC)  F33.1    F41.9   2. Borderline personality disorder (HCC)  F60.3     Past Psychiatric History: Please see initial evaluation for full details. I have reviewed the history. No updates at this time.     Past Medical History: No past medical history on file. No past surgical history on file.  Family Psychiatric History: Please see initial evaluation for full details. I have reviewed the history. No updates at this time.     Family History:  Family History  Problem Relation Age of Onset  . Anxiety disorder Mother   . Depression Mother   . Anxiety disorder Maternal Aunt   . Anxiety disorder Maternal Grandmother     Social History:  Social History   Socioeconomic History  . Marital status: Single    Spouse name: Not on file  . Number of children: Not on file  . Years of education: Not on file  . Highest education level: Not on file  Occupational History  . Not on file  Tobacco Use  . Smoking status: Never Smoker  . Smokeless tobacco: Never Used  Substance and  Sexual Activity  . Alcohol use: Yes    Comment: occ.  . Drug use: No  . Sexual activity: Not on file  Other Topics Concern  . Not on file  Social History Narrative  . Not on file   Social Determinants of Health   Financial Resource Strain: Not on file  Food Insecurity: Not on file  Transportation Needs: Not on file  Physical Activity: Not on file  Stress: Not on file  Social Connections: Not on file    Allergies: No Known Allergies  Metabolic Disorder Labs: No results found for: HGBA1C, MPG No results found for: PROLACTIN No results found  for: CHOL, TRIG, HDL, CHOLHDL, VLDL, LDLCALC Lab Results  Component Value Date   TSH 0.69 08/28/2018    Therapeutic Level Labs: No results found for: LITHIUM No results found for: VALPROATE No components found for:  CBMZ  Current Medications: Current Outpatient Medications  Medication Sig Dispense Refill  . ARIPiprazole (ABILIFY) 15 MG tablet Take 1 tablet (15 mg total) by mouth daily. 30 tablet 0  . amLODipine (NORVASC) 5 MG tablet TAKE 1 TABLET BY MOUTH DAILY WITH OLMERSARTAN    . ARIPiprazole (ABILIFY) 10 MG tablet Take 1 tablet (10 mg total) by mouth daily. 30 tablet 1  . ARIPiprazole (ABILIFY) 2 MG tablet Take 1 tablet (2 mg total) by mouth daily. 30 tablet 1  . escitalopram (LEXAPRO) 20 MG tablet Take 1 tablet (20 mg total) by mouth daily. 90 tablet 0  . hydrochlorothiazide (MICROZIDE) 12.5 MG capsule Take 12.5 mg by mouth daily.    Marland Kitchen ibuprofen (ADVIL,MOTRIN) 200 MG tablet Take 200 mg by mouth every 6 (six) hours as needed for mild pain or moderate pain.    Marland Kitchen olmesartan (BENICAR) 20 MG tablet TAKE 1 TABLET BY MOUTH DAILY FOR BLOOD PRESSURE WITH AMLODIPINE    . traZODone (DESYREL) 50 MG tablet Take 1 tablet (50 mg total) by mouth at bedtime as needed for sleep. 90 tablet 0   No current facility-administered medications for this visit.     Musculoskeletal: Strength & Muscle Tone: N/A Gait & Station: N/A Patient leans: N/A  Psychiatric Specialty Exam: Review of Systems  Psychiatric/Behavioral: Positive for decreased concentration and dysphoric mood. Negative for agitation, behavioral problems, confusion, hallucinations, self-injury, sleep disturbance and suicidal ideas. The patient is nervous/anxious. The patient is not hyperactive.   All other systems reviewed and are negative.   There were no vitals taken for this visit.There is no height or weight on file to calculate BMI.  General Appearance: Fairly Groomed  Eye Contact:  Good  Speech:  Clear and Coherent  Volume:   Normal  Mood:  Depressed  Affect:  Appropriate, Congruent and Tearful  Thought Process:  Coherent  Orientation:  Full (Time, Place, and Person)  Thought Content: Logical   Suicidal Thoughts:  Yes.  without intent/plan  Homicidal Thoughts:  No  Memory:  Immediate;   Good  Judgement:  Good  Insight:  Fair  Psychomotor Activity:  Normal  Concentration:  Concentration: Good and Attention Span: Good  Recall:  Good  Fund of Knowledge: Good  Language: Good  Akathisia:  No  Handed:  Right  AIMS (if indicated): not done  Assets:  Communication Skills Desire for Improvement  ADL's:  Intact  Cognition: WNL  Sleep:  Fair   Screenings: PHQ2-9   Flowsheet Row Video Visit from confidential encounter on 09/17/2020  PHQ-2 Total Score 2  PHQ-9 Total Score 12    Flowsheet Row  Video Visit from confidential encounter on 09/17/2020  C-SSRS RISK CATEGORY Error: Q3, 4, or 5 should not be populated when Q2 is No       Assessment and Plan:  Troy Mahoney is a 30 y.o. year old male with a history of depression, hypertension , who presents for follow up appointment for below.   1. Recurrent moderate major depressive disorder with anxiety (HCC) 2. Borderline personality disorder [facility]) Exam is notable for emotional lability, and he has depressive symptoms since the last visit. Psychosocial stressors includes work, and being a caregiver of his mother, and conflict with his sister.  We do further up titration of Abilify to optimize treatment for mood dysregulation and as adjunctive treatment for depression.  We will continue Lexapro to target depression.   # r/o sleep apnea He reports insomnia, snoring and fatigue.  Referral was made for evaluation of sleep apnea.   This clinician has discussed the side effect associated with medication prescribed during this encounter. Please refer to notes in the previous encounters for more details.   Plan I have reviewed and updated plans as  below 1.Continuelexapro20 mg daily 2.IncreaseAbilify15mg  daily  3.Next appointment:5/3at 9 AMfor 30 mins, [email or web address] 4. Referral for evaluation of sleep apnea - he rarely takes Trazodone  Past trials of medication:sertraline (insomnia), bupropion, lamotrigine (weight gain)  I would support that he would take intermittent leave of absence for a flareup of his symptoms due to worsening in his mood, which interferes with his ability to complete tasks, and have effective communication with other people.   The patient demonstrates the following risk factors for suicide: Chronic risk factors for suicide include:psychiatric disorder ofdepression. Acute risk factorsfor suicide include: loss (financial, interpersonal, professional). Protective factorsfor this patient include: responsibility to others (children, family), coping skills and hope for the future. Considering these factors, the overall suicide risk at this point appears to below.He denies gun access at home.Patientisappropriate for outpatient follow up.   [name], MD 10/05/2020, 12:00 PM

## 2020-10-13 ENCOUNTER — Ambulatory Visit (INDEPENDENT_AMBULATORY_CARE_PROVIDER_SITE_OTHER): Payer: 59 | Admitting: Clinical

## 2020-10-13 ENCOUNTER — Other Ambulatory Visit: Payer: Self-pay

## 2020-10-13 DIAGNOSIS — F331 Major depressive disorder, recurrent, moderate: Secondary | ICD-10-CM | POA: Diagnosis not present

## 2020-10-13 DIAGNOSIS — F419 Anxiety disorder, unspecified: Secondary | ICD-10-CM

## 2020-10-13 NOTE — Progress Notes (Signed)
Virtual Visit via Telephone Note  I connected withLedonimon Mahoney Mahoney on 10/13/20 at  8:00 AM EDT by telephoneand verified that I am speaking with the correct person using two identifiers.  Location: Patient: Home Provider: Office  I discussed the limitations, risks, security and privacy concerns of performing an evaluation and management service by telephone and the availability of in person appointments. I also discussed with the patient that there may be a patient responsible charge related to this service. The patient expressed understanding and agreed to proceed.  THERAPIST PROGRESS NOTE  Session Time:8:00AM-8:55AM  Participation Level:Active  Behavioral Response:CasualAlertDepressed  Type of Therapy:Individual Therapy  Treatment Goals addressed:Coping  Interventions:CBT, Motivational Interviewing, Solution Focused and Supportive  Summary:Troy Mahoney Foyeis a 30 y.o.malewho presents with Depression and AnxietyThe OPT therapist worked with thepatientfor his OPT treatment. The OPT therapist utilized Motivational Interviewing to assist in creating therapeutic repore. The patient in the session was engaged and work in collaboration giving feedback about his triggers and symptoms over the past few weeksincludingjob stress and the patient is currently taking a leave of absence from his work and hoping they will approve a transition from full time to part time moving forward.The OPT therapist utilized Cognitive Behavioral Therapy through cognitive restructuring as well as worked with the patient on coping strategies to assist in management of Depression and Anxiety.The OPT therapist worked with the patient toexamine options/ steps to help him manage current stressors and use decision making steps to assist in making decisions. The OPT therapist placed emphasis on balancing work and leisure/ self care.  Suicidal/Homicidal:Nowithout intent/plan  Therapist  Response:The OPT therapist worked with the patient for the patients scheduled session. The patient was engaged in his session and gave feedback in relation to triggers, symptoms, and behavior responses over the pastfewweeks. The OPT therapist worked with the patient utilizing an in session Cognitive Behavioral Therapy exercise.The OPT therapist worked with the patient on postive thinking The patient was responsive in the session and verbalized,"Iam just waiting to see what option my work offers and hoping they will consider allowing me to work half days".The OPT therapist will continue treatment work with the patient in his next scheduled session  Plan: Return again in2/3weeks.  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 The patient was provided an opportunity to ask questions and all were answered. The patient agreed with the plan and demonstrated an understanding of the instructions.  The patient was advised to call back or seek an in-person evaluation if the symptoms worsen or if the condition fails to improve as anticipated.  I provided87minutes of non-face-to-face time during this encounter  Aurther Loft T.Montez Morita, [place]  10/13/2020

## 2020-10-26 NOTE — Progress Notes (Signed)
Virtual Visit via Video Note  I connected with Troy Mahoney on 11/03/20 at  9:00 AM EDT by a video enabled telemedicine application and verified that I am speaking with the correct person using two identifiers.  Location: Patient: home Provider: office Persons participated in the visit- patient, provider   I discussed the limitations of evaluation and management by telemedicine and the availability of in person appointments. The patient expressed understanding and agreed to proceed.   I discussed the assessment and treatment plan with the patient. The patient was provided an opportunity to ask questions and all were answered. The patient agreed with the plan and demonstrated an understanding of the instructions.   The patient was advised to call back or seek an in-person evaluation if the symptoms worsen or if the condition fails to improve as anticipated.  I provided 21 minutes of non-face-to-face time during this encounter.   [name], MD    [facility] MD/PA/NP OP Progress Note  11/03/2020 9:36 AM Troy Mahoney  MRN:  [id number]  Chief Complaint:  Chief Complaint    Follow-up; Depression     HPI:  This is a follow-up appointment for depression and borderline personality disorder.  He states that he has been frustrated.  He feels that everything is not right, and he has no positive outlet.  He talks about frustration of the FMLA/accommodation process.  He now thinks that this job may not be right for him.  Although he has tried hard, they are not giving him a chance. He feels emotionally checked out.  He also talks about the frustration that his family/friends forced me to share things with them, and he gets emotional although he does not want to.  However, he thinks that his decision making has more clarity, and he has started to find ways to control his emotions since up titration of Abilify.  He has depressive symptoms as in PHQ-9.  He had passive SI when he had a panic attack.   He denies any plan or intent.  He has been drinking 2 coffees as he feels fatigue.  He had 1 panic attack when he heard about the news from courthouse regarding his speeding ticket in Feb. 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  He has been eating healthier food, and did work out yesterday, which she liked.  He feels comfortable to stay on the current medication regimen.      Daily routine: Employment:DSNP retention specialist Household:mother who is on PD Marital status:single Number of children:0     Visit Diagnosis:    ICD-10-CM   1. Recurrent moderate major depressive disorder with anxiety (HCC)  F33.1    F41.9   2. Borderline personality disorder (HCC)  F60.3     Past Psychiatric History: Please see initial evaluation for full details. I have reviewed the history. No updates at this time.     Past Medical History: No past medical history on file. No past surgical history on file.  Family Psychiatric History: Please see initial evaluation for full details. I have reviewed the history. No updates at this time.     Family History:  Family History  Problem Relation Age of Onset  . Anxiety disorder Mother   . Depression Mother   . Anxiety disorder Maternal Aunt   . Anxiety disorder Maternal Grandmother     Social History:  Social History   Socioeconomic History  . Marital status: Single    Spouse  name: Not on file  . Number of children: Not on file  . Years of education: Not on file  . Highest education level: Not on file  Occupational History  . Not on file  Tobacco Use  . Smoking status: Never Smoker  . Smokeless tobacco: Never Used  Substance and Sexual Activity  . Alcohol use: Yes    Comment: occ.  . Drug use: No  . Sexual activity: Not on file  Other Topics Concern  . Not on file  Social History Narrative  . Not on file   Social Determinants of Health   Financial Resource Strain: Not on file  Food Insecurity: Not  on file  Transportation Needs: Not on file  Physical Activity: Not on file  Stress: Not on file  Social Connections: Not on file    Allergies: No Known Allergies  Metabolic Disorder Labs: No results found for: HGBA1C, MPG No results found for: PROLACTIN No results found for: CHOL, TRIG, HDL, CHOLHDL, VLDL, LDLCALC Lab Results  Component Value Date   TSH 0.69 08/28/2018    Therapeutic Level Labs: No results found for: LITHIUM No results found for: VALPROATE No components found for:  CBMZ  Current Medications: Current Outpatient Medications  Medication Sig Dispense Refill  . amLODipine (NORVASC) 5 MG tablet TAKE 1 TABLET BY MOUTH DAILY WITH OLMERSARTAN    . ARIPiprazole (ABILIFY) 15 MG tablet Take 1 tablet (15 mg total) by mouth daily. 90 tablet 0  . [START ON 11/18/2020] escitalopram (LEXAPRO) 20 MG tablet Take 1 tablet (20 mg total) by mouth daily. 90 tablet 0  . hydrochlorothiazide (MICROZIDE) 12.5 MG capsule Take 12.5 mg by mouth daily.    Marland Kitchen ibuprofen (ADVIL,MOTRIN) 200 MG tablet Take 200 mg by mouth every 6 (six) hours as needed for mild pain or moderate pain.    Marland Kitchen olmesartan (BENICAR) 20 MG tablet TAKE 1 TABLET BY MOUTH DAILY FOR BLOOD PRESSURE WITH AMLODIPINE    . traZODone (DESYREL) 50 MG tablet Take 1 tablet (50 mg total) by mouth at bedtime as needed for sleep. 90 tablet 0   No current facility-administered medications for this visit.     Musculoskeletal: Strength & Muscle Tone: N/A Gait & Station: N/A Patient leans: N/A  Psychiatric Specialty Exam: Review of Systems  Psychiatric/Behavioral: Positive for decreased concentration, dysphoric mood and suicidal ideas. Negative for agitation, behavioral problems, confusion, hallucinations, self-injury and sleep disturbance. The patient is nervous/anxious. The patient is not hyperactive.   All other systems reviewed and are negative.   There were no vitals taken for this visit.There is no height or weight on file to  calculate BMI.  General Appearance: Fairly Groomed  Eye Contact:  Good  Speech:  Clear and Coherent  Volume:  Normal  Mood:  frustrated  Affect:  Appropriate, Congruent and Labile  Thought Process:  Coherent  Orientation:  Full (Time, Place, and Person)  Thought Content: Logical   Suicidal Thoughts:  No  Homicidal Thoughts:  No  Memory:  Immediate;   Good  Judgement:  Good  Insight:  Fair  Psychomotor Activity:  Normal  Concentration:  Concentration: Good and Attention Span: Good  Recall:  Good  Fund of Knowledge: Good  Language: Good  Akathisia:  No  Handed:  Right  AIMS (if indicated): not done  Assets:  Communication Skills Desire for Improvement  ADL's:  Intact  Cognition: WNL  Sleep:  Fair   Screenings: PHQ2-9   Flowsheet Row Video Visit from confidential encounter on  11/03/2020 Video Visit from confidential encounter on 09/17/2020  PHQ-2 Total Score 2 2  PHQ-9 Total Score 11 12    Flowsheet Row Video Visit from confidential encounter on 11/03/2020 Video Visit from confidential encounter on 09/17/2020  C-SSRS RISK CATEGORY Error: Q3, 4, or 5 should not be populated when Q2 is No Error: Q3, 4, or 5 should not be populated when Q2 is No       Assessment and Plan:  AVYUKT CIMO is a 30 y.o. year old male with a history of  depression, hypertension, who presents for follow up appointment for below.   1. Recurrent moderate major depressive disorder with anxiety (HCC) 2. Borderline personality disorder (HCC) Although he continues to demonstrate emotional lability, there has been overall improvement in depressive symptoms and emotional dysregulation since the last visit. Psychosocial stressors includes work,and being a caregiver of his mother, and conflict with his sister.  Will continue current dose of Lexapro to target depression.  We will continue Abilify as adjunctive treatment for depression and also to target mood dysregulation.   # r/o sleep apnea He reports  insomnia,snoring and fatigue.Referral was made for evaluation of sleep apnea; he is unable to make an appointment due to lack of transportation.   This clinician has discussed the side effect associated with medication prescribed during this encounter. Please refer to notes in the previous encounters for more details.   It is my clinical opinion that he is at high risk of relapse in his mood symptoms in stressful situations such as work. I would support that he takes absence from work for flare up of symptoms, which interferes with his ability to complete tasks or to effectively communicate with other people.  3 days per episode, 4 episodes per month.    Plan  1.Continuelexapro20 mg daily 2 ContinueAbilify15mg  daily  3.Next appointment:6/3 at 11:32for 30 mins, [email or web address] - he rarely takes Trazodone  Past trials of medication:sertraline (insomnia), bupropion, lamotrigine (weight gain)  I have reviewed suicide assessment in detail. No change in the following assessment.   The patient demonstrates the following risk factors for suicide: Chronic risk factors for suicide include:psychiatric disorder ofdepression. Acute risk factorsfor suicide include: loss (financial, interpersonal, professional). Protective factorsfor this patient include: responsibility to others (children, family), coping skills and hope for the future. Considering these factors, the overall suicide risk at this point appears to below.He denies gun access at home.Patientisappropriate for outpatient follow up.  [name], MD 11/03/2020, 9:35 AM

## 2020-11-03 ENCOUNTER — Encounter: Payer: Self-pay | Admitting: Psychiatry

## 2020-11-03 ENCOUNTER — Telehealth (INDEPENDENT_AMBULATORY_CARE_PROVIDER_SITE_OTHER): Payer: 59 | Admitting: Psychiatry

## 2020-11-03 ENCOUNTER — Other Ambulatory Visit: Payer: Self-pay

## 2020-11-03 DIAGNOSIS — F419 Anxiety disorder, unspecified: Secondary | ICD-10-CM

## 2020-11-03 DIAGNOSIS — F603 Borderline personality disorder: Secondary | ICD-10-CM

## 2020-11-03 DIAGNOSIS — F331 Major depressive disorder, recurrent, moderate: Secondary | ICD-10-CM

## 2020-11-03 MED ORDER — ESCITALOPRAM OXALATE 20 MG PO TABS: 20.0000 mg | ORAL_TABLET | Freq: Every day | ORAL | 0 refills | Status: DC

## 2020-11-03 MED ORDER — ARIPIPRAZOLE 15 MG PO TABS: 15.0000 mg | ORAL_TABLET | Freq: Every day | ORAL | 0 refills | Status: DC

## 2020-12-01 NOTE — Progress Notes (Signed)
Virtual Visit via Video Note  I connected with Troy Mahoney on 12/04/20 at 11:30 AM EDT by a video enabled telemedicine application and verified that I am speaking with the correct person using two identifiers.  Location: Patient: home Provider: office Persons participated in the visit- patient, provider   I discussed the limitations of evaluation and management by telemedicine and the availability of in person appointments. The patient expressed understanding and agreed to proceed.  I discussed the assessment and treatment plan with the patient. The patient was provided an opportunity to ask questions and all were answered. The patient agreed with the plan and demonstrated an understanding of the instructions.   The patient was advised to call back or seek an in-person evaluation if the symptoms worsen or if the condition fails to improve as anticipated.  I provided 15 minutes of non-face-to-face time during this encounter.   [name], MD    [facility] MD/PA/NP OP Progress Note  12/04/2020 11:58 AM Troy Mahoney  MRN:  [id number]  Chief Complaint:  Chief Complaint    Follow-up; Depression     HPI:  This is a follow-up appointment for depression and borderline personality disorder.  He states that there has not been much change since the last visit. He feels a ball of emotion. He feels on edge and anxious. He has not heard back from work about his work status, and heard that he will get back from them in a few days.  He is actively looking for a job.  However, he is concerned of leaving the current job and ends up in the more situation.  He tends to isolate himself and snappy.  He talks about an interaction with his friend the other day.  Although he did enjoy going to the party, he felt more depressed and had passive SI, answering questions from people.  This made him feel less.  He denies having any plan or intent at that time.  He denies any SI since then.  He has "repeated cycle  "when he is asked about the relationship with his mother.  Her medical condition is good, and she will go to a cruise.  He has difficulty in concentration. He feels good that he lost 7 pounds blood past 3 weeks since he started working out, and eating healthy.   He is willing to try a higher dose of Abilify; he feels less anxious since being on this medication.     253 lbs Wt Readings from Last 3 Encounters:  09/25/18 248 lb (112.5 kg)  08/28/18 251 lb (113.9 kg)  07/26/18 253 lb (114.8 kg)    Daily routine: Employment:DSNP retention specialist Household:mother who is on PD Marital status:single Number of children:0  Visit Diagnosis:    ICD-10-CM   1. Recurrent moderate major depressive disorder with anxiety (HCC)  F33.1    F41.9   2. Borderline personality disorder (HCC)  F60.3     Past Psychiatric History: Please see initial evaluation for full details. I have reviewed the history. No updates at this time.     Past Medical History: No past medical history on file. No past surgical history on file.  Family Psychiatric History: Please see initial evaluation for full details. I have reviewed the history. No updates at this time.     Family History:  Family History  Problem Relation Age of Onset  . Anxiety disorder Mother   . Depression Mother   . Anxiety disorder Maternal Aunt   .  Anxiety disorder Maternal Grandmother     Social History:  Social History   Socioeconomic History  . Marital status: Single    Spouse name: Not on file  . Number of children: Not on file  . Years of education: Not on file  . Highest education level: Not on file  Occupational History  . Not on file  Tobacco Use  . Smoking status: Never Smoker  . Smokeless tobacco: Never Used  Substance and Sexual Activity  . Alcohol use: Yes    Comment: occ.  . Drug use: No  . Sexual activity: Not on file  Other Topics Concern  . Not on file  Social History Narrative  . Not on file    Social Determinants of Health   Financial Resource Strain: Not on file  Food Insecurity: Not on file  Transportation Needs: Not on file  Physical Activity: Not on file  Stress: Not on file  Social Connections: Not on file    Allergies: No Known Allergies  Metabolic Disorder Labs: No results found for: HGBA1C, MPG No results found for: PROLACTIN No results found for: CHOL, TRIG, HDL, CHOLHDL, VLDL, LDLCALC Lab Results  Component Value Date   TSH 0.69 08/28/2018    Therapeutic Level Labs: No results found for: LITHIUM No results found for: VALPROATE No components found for:  CBMZ  Current Medications: Current Outpatient Medications  Medication Sig Dispense Refill  . ARIPiprazole (ABILIFY) 20 MG tablet Take 1 tablet (20 mg total) by mouth daily. 30 tablet 1  . amLODipine (NORVASC) 5 MG tablet TAKE 1 TABLET BY MOUTH DAILY WITH OLMERSARTAN    . ARIPiprazole (ABILIFY) 15 MG tablet Take 1 tablet (15 mg total) by mouth daily. 90 tablet 0  . escitalopram (LEXAPRO) 20 MG tablet Take 1 tablet (20 mg total) by mouth daily. 90 tablet 0  . hydrochlorothiazide (MICROZIDE) 12.5 MG capsule Take 12.5 mg by mouth daily.    Marland Kitchen ibuprofen (ADVIL,MOTRIN) 200 MG tablet Take 200 mg by mouth every 6 (six) hours as needed for mild pain or moderate pain.    Marland Kitchen olmesartan (BENICAR) 20 MG tablet TAKE 1 TABLET BY MOUTH DAILY FOR BLOOD PRESSURE WITH AMLODIPINE    . traZODone (DESYREL) 50 MG tablet Take 1 tablet (50 mg total) by mouth at bedtime as needed for sleep. 90 tablet 0   No current facility-administered medications for this visit.     Musculoskeletal: Strength & Muscle Tone: N/A Gait & Station: N/A Patient leans: N/A  Psychiatric Specialty Exam: Review of Systems  Psychiatric/Behavioral: Positive for decreased concentration, dysphoric mood and sleep disturbance. Negative for agitation, behavioral problems, confusion, hallucinations, self-injury and suicidal ideas. The patient is  nervous/anxious. The patient is not hyperactive.   All other systems reviewed and are negative.   There were no vitals taken for this visit.There is no height or weight on file to calculate BMI.  General Appearance: Fairly Groomed  Eye Contact:  Good  Speech:  Clear and Coherent  Volume:  Normal  Mood:  Depressed  Affect:  Appropriate, Congruent and Labile  Thought Process:  Coherent  Orientation:  Full (Time, Place, and Person)  Thought Content: Logical   Suicidal Thoughts:  No  Homicidal Thoughts:  No  Memory:  Immediate;   Good  Judgement:  Good  Insight:  Fair  Psychomotor Activity:  Normal  Concentration:  Concentration: Good and Attention Span: Good  Recall:  Good  Fund of Knowledge: Good  Language: Good  Akathisia:  No  Handed:  Right  AIMS (if indicated): not done  Assets:  Communication Skills Desire for Improvement  ADL's:  Intact  Cognition: WNL  Sleep:  Fair   Screenings: PHQ2-9   Flowsheet Row Video Visit from confidential encounter on 11/03/2020 Video Visit from confidential encounter on 09/17/2020  PHQ-2 Total Score 2 2  PHQ-9 Total Score 11 12    Flowsheet Row Video Visit from confidential encounter on 11/03/2020 Video Visit from confidential encounter on 09/17/2020  C-SSRS RISK CATEGORY Error: Q3, 4, or 5 should not be populated when Q2 is No Error: Q3, 4, or 5 should not be populated when Q2 is No       Assessment and Plan:  Troy Mahoney is a 30 y.o. year old male with a history of depression, hypertension, who presents for follow up appointment for below.   1. Recurrent moderate major depressive disorder with anxiety (HCC) 2. Borderline personality disorder (HCC) He continues to report depressive symptoms with irritability, although there has been overall improvement in anxiety since the last visit. Psychosocial stressors includes work,and being a caregiver of his mother, conflict with his sister.    Will uptitrate Abilify to optimize treatment for  depression and mood dysregulation.  Discussed potential risk of metabolic side effect and EPS.   # r/o sleep apnea He reports insomnia,snoring and fatigue.Referral was [facility] evaluation of sleep apnea; he is unable to make an appointment due to lack of transportation.   This clinician has discussed the side effect associated with medication prescribed during this encounter. Please refer to notes in the previous encounters for more details.   It is my clinical opinion that he is at high risk of relapse in his mood symptoms in stressful situations such as work. I would support that he takes absence from work for flare up of symptoms, which interferes with his ability to complete tasks or to effectively communicate with other people.  3 days per episode, 4 episodes per month.   Plan I have reviewed and updated plans as below 1. Continuelexapro20 mg daily 2  Increase Abilify20mg  daily  3.Next appointment:7/14 at 2:30 for 30 mins, [email or web address] - he rarely takes Trazodone  Past trials of medication:sertraline (insomnia), bupropion, lamotrigine (weight gain)  The patient demonstrates the following risk factors for suicide: Chronic risk factors for suicide include:psychiatric disorder ofdepression. Acute risk factorsfor suicide include: loss (financial, interpersonal, professional). Protective factorsfor this patient include: responsibility to others (children, family), coping skills and hope for the future. Considering these factors, the overall suicide risk at this point appears to below.He denies gun access at home.Patientisappropriate for outpatient follow up.  [name], MD 12/04/2020, 11:58 AM

## 2020-12-04 ENCOUNTER — Telehealth (INDEPENDENT_AMBULATORY_CARE_PROVIDER_SITE_OTHER): Payer: 59 | Admitting: Psychiatry

## 2020-12-04 ENCOUNTER — Encounter: Payer: Self-pay | Admitting: Psychiatry

## 2020-12-04 ENCOUNTER — Other Ambulatory Visit: Payer: Self-pay

## 2020-12-04 DIAGNOSIS — F331 Major depressive disorder, recurrent, moderate: Secondary | ICD-10-CM

## 2020-12-04 DIAGNOSIS — F419 Anxiety disorder, unspecified: Secondary | ICD-10-CM

## 2020-12-04 DIAGNOSIS — F603 Borderline personality disorder: Secondary | ICD-10-CM

## 2020-12-04 MED ORDER — ARIPIPRAZOLE 20 MG PO TABS: 20.0000 mg | ORAL_TABLET | Freq: Every day | ORAL | 1 refills | Status: DC

## 2020-12-04 NOTE — Patient Instructions (Signed)
1. Continuelexapro20 mg daily 2  Incrase Abilify20mg  daily  3.Next appointment:7/14 at 2:30

## 2021-01-11 NOTE — Progress Notes (Signed)
Virtual Visit via Video Note  I connected with Troy Mahoney on 01/14/21 at  2:30 PM EDT by a video enabled telemedicine application and verified that I am speaking with the correct person using two identifiers.  Location: Patient: home Provider: office Persons participated in the visit- patient, provider    I discussed the limitations of evaluation and management by telemedicine and the availability of in person appointments. The patient expressed understanding and agreed to proceed.    I discussed the assessment and treatment plan with the patient. The patient was provided an opportunity to ask questions and all were answered. The patient agreed with the plan and demonstrated an understanding of the instructions.   The patient was advised to call back or seek an in-person evaluation if the symptoms worsen or if the condition fails to improve as anticipated.  I provided 15 minutes of non-face-to-face time during this encounter.   [name], MD     [facility] MD/PA/NP OP Progress Note  01/14/2021 2:58 PM Troy Mahoney  MRN:  [id number]  Chief Complaint:  Chief Complaint   Depression; Follow-up; Other    HPI:  This is a follow-up appointment for depression and borderline personality disorder.  He states that he finally has made in an action.  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  It knocked him out of routine for the past few weeks.  He has been feeling depressed on and off, although he tries not falling back to depressive state.  He is hoping to find another career.  However, he always have negative self talk in his mind, and it has been difficult for him to give himself credit.  He agrees to work on this skill, and agreed that it was a great step for him to actually put things into action.  He has been trying to cut out many people, who is not positive.  He feels that medication is more in his body; it keeps him more  focused and he likes it.  He has fair sleep.  He has mild anhedonia.  He has occasional irritability.  He feels good about his weight loss and related to his decreased appetite.  He had few passive fleeting Si, although he denies any plan or intent.  He denies alcohol use or drug use.    257 lbs (reportedly lost 15 pounds in the past few months) Wt Readings from Last 3 Encounters:  09/25/18 248 lb (112.5 kg)  08/28/18 251 lb (113.9 kg)  07/26/18 253 lb (114.8 kg)      Visit Diagnosis:    ICD-10-CM   1. Recurrent moderate major depressive disorder with anxiety (HCC)  F33.1    F41.9     2. Borderline personality disorder (HCC)  F60.3       Past Psychiatric History: Please see initial evaluation for full details. I have reviewed the history. No updates at this time.     Past Medical History: No past medical history on file. No past surgical history on file.  Family Psychiatric History: Please see initial evaluation for full details. I have reviewed the history. No updates at this time.     Family History:  Family History  Problem Relation Age of Onset   Anxiety disorder Mother    Depression Mother    Anxiety disorder Maternal Aunt    Anxiety disorder Maternal Grandmother     Social History:  Social History  Socioeconomic History   Marital status: Single    Spouse name: Not on file   Number of children: Not on file   Years of education: Not on file   Highest education level: Not on file  Occupational History   Not on file  Tobacco Use   Smoking status: Never   Smokeless tobacco: Never  Substance and Sexual Activity   Alcohol use: Yes    Comment: occ.   Drug use: No   Sexual activity: Not on file  Other Topics Concern   Not on file  Social History Narrative   Not on file   Social Determinants of Health   Financial Resource Strain: Not on file  Food Insecurity: Not on file  Transportation Needs: Not on file  Physical Activity: Not on file  Stress: Not on  file  Social Connections: Not on file    Allergies: No Known Allergies  Metabolic Disorder Labs: No results found for: HGBA1C, MPG No results found for: PROLACTIN No results found for: CHOL, TRIG, HDL, CHOLHDL, VLDL, LDLCALC Lab Results  Component Value Date   TSH 0.69 08/28/2018    Therapeutic Level Labs: No results found for: LITHIUM No results found for: VALPROATE No components found for:  CBMZ  Current Medications: Current Outpatient Medications  Medication Sig Dispense Refill   amLODipine (NORVASC) 5 MG tablet TAKE 1 TABLET BY MOUTH DAILY WITH OLMERSARTAN     [START ON 02/03/2021] ARIPiprazole (ABILIFY) 20 MG tablet Take 1 tablet (20 mg total) by mouth daily. 90 tablet 0   [START ON 02/18/2021] escitalopram (LEXAPRO) 20 MG tablet Take 1 tablet (20 mg total) by mouth daily. 90 tablet 0   hydrochlorothiazide (MICROZIDE) 12.5 MG capsule Take 12.5 mg by mouth daily.     ibuprofen (ADVIL,MOTRIN) 200 MG tablet Take 200 mg by mouth every 6 (six) hours as needed for mild pain or moderate pain.     olmesartan (BENICAR) 20 MG tablet TAKE 1 TABLET BY MOUTH DAILY FOR BLOOD PRESSURE WITH AMLODIPINE     traZODone (DESYREL) 50 MG tablet Take 1 tablet (50 mg total) by mouth at bedtime as needed for sleep. 90 tablet 0   No current facility-administered medications for this visit.     Musculoskeletal: Strength & Muscle Tone:  N/A Gait & Station:  N/A Patient leans: N/A  Psychiatric Specialty Exam: Review of Systems  Psychiatric/Behavioral:  Positive for decreased concentration, dysphoric mood, sleep disturbance and suicidal ideas. Negative for agitation, behavioral problems, confusion, hallucinations and self-injury. The patient is nervous/anxious. The patient is not hyperactive.   All other systems reviewed and are negative.  There were no vitals taken for this visit.There is no height or weight on file to calculate BMI.  General Appearance: Fairly Groomed  Eye Contact:  Good  Speech:   Clear and Coherent  Volume:  Normal  Mood:  Depressed  Affect:  Appropriate, Congruent, and slightly tense but reactive  Thought Process:  Coherent  Orientation:  Full (Time, Place, and Person)  Thought Content: Logical   Suicidal Thoughts:  Yes.  without intent/plan  Homicidal Thoughts:  No  Memory:  Immediate;   Good  Judgement:  Good  Insight:  Good  Psychomotor Activity:  Normal  Concentration:  Concentration: Good and Attention Span: Good  Recall:  Good  Fund of Knowledge: Good  Language: Good  Akathisia:  No  Handed:  Right  AIMS (if indicated): not done  Assets:  Communication Skills Desire for Improvement  ADL's:  Intact  Cognition: WNL  Sleep:  Fair   Screenings: PHQ2-9    Flowsheet Row Video Visit from confidential encounter on 11/03/2020 Video Visit from confidential encounter on 09/17/2020  PHQ-2 Total Score 2 2  PHQ-9 Total Score 11 12      Flowsheet Row Video Visit from confidential encounter on 01/14/2021 Video Visit from confidential encounter on 11/03/2020 Video Visit from confidential encounter on 09/17/2020  C-SSRS RISK CATEGORY Low Risk Error: Q3, 4, or 5 should not be populated when Q2 is No Error: Q3, 4, or 5 should not be populated when Q2 is No        Assessment and Plan:  KENTO GOSSMAN is a 30 y.o. year old male with a history of depression, hypertension, who presents for follow up appointment for below.   1. Recurrent moderate major depressive disorder with anxiety (HCC) 2. Borderline personality disorder (HCC) Although he continues to report depressive symptoms with occasional irritability and passive fleeting SI, there has been overall improvement since the last visit.  Psychosocial stressors includes future career prospects, being a caregiver of his mother, conflict with his sister.  He is future oriented, and is willing to develop skills to work on cognitive distorsion.  Will continue Lexapro to target depression.  Will continue Abilify as  adjunctive treatment for depression and mood dysregulation.   # r/o sleep apnea Unchanged. He reports insomnia, snoring and fatigue.  Referral was made for evaluation of sleep apnea; he is unable to make an appointment due to lack of transportation.      It is my clinical opinion that he is at high risk of relapse in his mood symptoms in stressful situations such as work. I would support that he takes absence from work for flare up of symptoms, which interferes with his ability to complete tasks or to effectively communicate with other people.  3 days per episode, 4 episodes per month.   This clinician has discussed the side effect associated with medication prescribed during this encounter. Please refer to notes in the previous encounters for more details.    Plan I have reviewed and updated plans as below  1. Continue lexapro 20 mg daily   2   Continue Abilify 20 mg daily 3. Next appointment:  9/8 at 11:30 for 30 mins , video  [email or web address] - he rarely takes Trazodone    Past trials of medication: sertraline (insomnia) , bupropion, lamotrigine (weight gain)              I have reviewed suicide assessment in detail. No change in the following assessment.    The patient demonstrates the following risk factors for suicide: Chronic risk factors for suicide include: psychiatric disorder of depression. Acute risk factors for suicide include: loss (financial, interpersonal, professional). Protective factors for this patient include: responsibility to others (children, family), coping skills and hope for the future. Considering these factors, the overall suicide risk at this point appears to be low. He denies gun access at home. Patient is appropriate for outpatient follow up.       [name], MD 01/14/2021, 2:59 PM

## 2021-01-14 ENCOUNTER — Telehealth: Payer: 59 | Admitting: Psychiatry

## 2021-01-14 ENCOUNTER — Other Ambulatory Visit: Payer: Self-pay

## 2021-01-14 ENCOUNTER — Telehealth (INDEPENDENT_AMBULATORY_CARE_PROVIDER_SITE_OTHER): Payer: 59 | Admitting: Psychiatry

## 2021-01-14 ENCOUNTER — Encounter: Payer: Self-pay | Admitting: Psychiatry

## 2021-01-14 DIAGNOSIS — F603 Borderline personality disorder: Secondary | ICD-10-CM | POA: Diagnosis not present

## 2021-01-14 DIAGNOSIS — F419 Anxiety disorder, unspecified: Secondary | ICD-10-CM | POA: Diagnosis not present

## 2021-01-14 DIAGNOSIS — F331 Major depressive disorder, recurrent, moderate: Secondary | ICD-10-CM

## 2021-01-14 MED ORDER — ESCITALOPRAM OXALATE 20 MG PO TABS: 20.0000 mg | ORAL_TABLET | Freq: Every day | ORAL | 0 refills | Status: DC

## 2021-01-14 MED ORDER — ARIPIPRAZOLE 20 MG PO TABS: 20.0000 mg | ORAL_TABLET | Freq: Every day | ORAL | 0 refills | Status: DC

## 2021-01-14 NOTE — Patient Instructions (Signed)
1. Continue lexapro 20 mg daily   2   Continue Abilify 20 mg daily 3. Next appointment:  9/8 at 11:30   CONTACT INFORMATION  What to do if you need to get in touch with someone regarding a psychiatric issue:  1. EMERGENCY: For psychiatric emergencies (if you are suicidal or if there are any other safety issues) call 911 and/or go to your nearest Emergency Room immediately.   2. IF YOU NEED SOMEONE TO TALK TO RIGHT NOW: Given my clinical responsibilities, I may not be able to speak with you over the phone for a prolonged period of time.  A. You may always call The National Suicide Prevention Lifeline at 1-800-273-TALK 980-116-8494).  B. You may walk in to [facility]  Address: [address] [place], [place] [address], Phone: [phone].  Open 24/7, No appointment required. C. Your county of residence will also have local crisis services. For [facility]: [facility] at [phone] (24 Hour Crisis Hotline)

## 2021-01-19 ENCOUNTER — Ambulatory Visit (HOSPITAL_COMMUNITY): Payer: 59 | Admitting: Clinical

## 2021-01-19 ENCOUNTER — Telehealth (HOSPITAL_COMMUNITY): Payer: Self-pay | Admitting: Clinical

## 2021-01-19 ENCOUNTER — Other Ambulatory Visit: Payer: Self-pay

## 2021-01-19 NOTE — Telephone Encounter (Signed)
The patient did not respond to video link, phone call, or VM 

## 2021-01-27 ENCOUNTER — Other Ambulatory Visit: Payer: Self-pay

## 2021-01-27 ENCOUNTER — Ambulatory Visit (INDEPENDENT_AMBULATORY_CARE_PROVIDER_SITE_OTHER): Payer: 59 | Admitting: Clinical

## 2021-01-27 DIAGNOSIS — F331 Major depressive disorder, recurrent, moderate: Secondary | ICD-10-CM

## 2021-01-27 DIAGNOSIS — F419 Anxiety disorder, unspecified: Secondary | ICD-10-CM | POA: Diagnosis not present

## 2021-01-27 NOTE — Progress Notes (Signed)
Virtual Visit via Telephone Note   I connected with Troy Mahoney on 01/27/21 at  10:00 AM EDT by telephone and verified that I am speaking with the correct person using two identifiers.   Location: Patient: Home Provider: Office   I discussed the limitations, risks, security and privacy concerns of performing an evaluation and management service by telephone and the availability of in person appointments. I also discussed with the patient that there may be a patient responsible charge related to this service. The patient expressed understanding and agreed to proceed.   THERAPIST PROGRESS NOTE   Session Time: 10:00AM-10:45AM   Participation Level: Active   Behavioral Response: CasualAlertDepressed   Type of Therapy: Individual Therapy   Treatment Goals addressed: Coping   Interventions: CBT, Motivational Interviewing, Solution Focused and Supportive   Summary: Troy Mahoney is a 30 y.o. male who presents with Depression and Anxiety The OPT therapist worked with the patient for his OPT treatment. The OPT therapist utilized Motivational Interviewing to assist in creating therapeutic repore. The patient in the session was engaged and work in collaboration giving feedback about his triggers and symptoms over the past few weeks including losing his job due to several leaves of absence. The OPT therapist utilized Cognitive Behavioral Therapy through cognitive restructuring as well as worked with the patient on coping strategies to assist in management of Depression and Anxiety. The OPT therapist worked with the patient to examine options/ steps to help him manage current stressors and use decision making steps to assist in making decisions. The patient noted he is currently actively seeking new employement.   Suicidal/Homicidal: Nowithout intent/plan   Therapist Response: The OPT therapist worked with the patient for the patients scheduled session. The patient was engaged in his session  and gave feedback in relation to triggers, symptoms, and behavior responses over the past few weeks. The OPT therapist worked with the patient utilizing an in session Cognitive Behavioral Therapy exercise.The OPT therapist worked with the patient on postive thinking The patient was responsive in the session and verbalized, " I am just waiting to see what option are out there and I have been applying". The OPT therapist worked with the patient on considering the fit of his next job and finding a employer and type of work that he feels interested in. The OPT therapist will continue treatment work with the patient in his next scheduled session   Plan: Return again in 2/3 weeks.   Diagnosis:      Axis I: Recurrent moderate major depressive disorder with anxiety                           Axis II: No diagnosis     I discussed the assessment and treatment plan with the patient. The patient was provided an opportunity to ask questions and all were answered. The patient agreed with the plan and demonstrated an understanding of the instructions.   The patient was advised to call back or seek an in-person evaluation if the symptoms worsen or if the condition fails to improve as anticipated.   I provided 45 minutes of non-face-to-face time during this encounter   Aurther Loft T.Montez Morita, [place]   01/27/2021

## 2021-02-17 ENCOUNTER — Ambulatory Visit (HOSPITAL_COMMUNITY): Payer: 59 | Admitting: Clinical

## 2021-03-05 NOTE — Progress Notes (Signed)
Virtual Visit via Video Note  I connected with Troy Mahoney on 03/10/21 at 11:30 AM EDT by a video enabled telemedicine application and verified that I am speaking with the correct person using two identifiers.  Location: Patient: home Provider: office Persons participated in the visit- patient, provider    I discussed the limitations of evaluation and management by telemedicine and the availability of in person appointments. The patient expressed understanding and agreed to proceed.    I discussed the assessment and treatment plan with the patient. The patient was provided an opportunity to ask questions and all were answered. The patient agreed with the plan and demonstrated an understanding of the instructions.   The patient was advised to call back or seek an in-person evaluation if the symptoms worsen or if the condition fails to improve as anticipated.  I provided 22 minutes of non-face-to-face time during this encounter.   [name], MD    [facility] MD/PA/NP OP Progress Note  03/10/2021 12:07 PM Troy Mahoney  MRN:  [id number]  Chief Complaint:  Chief Complaint   Follow-up; Depression    HPI:  This is a follow-up appointment for depression.  He states that he was terminated from his job in late July.  He did not expect it, and it made him feel depressed.  He also talks about an episode of bringing his aunt to the hospital, where his father was admitted in the past.  It brought back the loss of his father when he was 9 year old.  He also talks about loss of many family members from teenagers.  He wonders if he will be good for trauma therapy.  He agreed that he has never had enough time to process each loss.  He always feels uneasy, being worried" what is next ."  He is willing to continue to work with Mr. Montez Morita to process grief.  He feels that there are bumps in the road, and he does not recover quickly enough.  However, he is actively trying to get a job, and tries to  focus on it.  He has depressive symptoms as in PHQ-9. He had passive SI a few weeks ago as he did not like to continue to feel this way.  He is willing to try bupropion again to see if it helps for his mood.  He also would like to be back on trazodone due to his insomnia.    Visit Diagnosis:    ICD-10-CM   1. Recurrent moderate major depressive disorder with anxiety (HCC)  F33.1    F41.9     2. Borderline personality disorder (HCC)  F60.3     3. Insomnia, unspecified type  G47.00       Past Psychiatric History: Please see initial evaluation for full details. I have reviewed the history. No updates at this time.     Past Medical History: No past medical history on file. No past surgical history on file.  Family Psychiatric History: Please see initial evaluation for full details. I have reviewed the history. No updates at this time.     Family History:  Family History  Problem Relation Age of Onset   Anxiety disorder Mother    Depression Mother    Anxiety disorder Maternal Aunt    Anxiety disorder Maternal Grandmother     Social History:  Social History   Socioeconomic History   Marital status: Single    Spouse name: Not on file   Number of children: Not on file  Years of education: Not on file   Highest education level: Not on file  Occupational History   Not on file  Tobacco Use   Smoking status: Never   Smokeless tobacco: Never  Substance and Sexual Activity   Alcohol use: Yes    Comment: occ.   Drug use: No   Sexual activity: Not on file  Other Topics Concern   Not on file  Social History Narrative   Not on file   Social Determinants of Health   Financial Resource Strain: Not on file  Food Insecurity: Not on file  Transportation Needs: Not on file  Physical Activity: Not on file  Stress: Not on file  Social Connections: Not on file    Allergies: No Known Allergies  Metabolic Disorder Labs: No results found for: HGBA1C, MPG No results found for:  PROLACTIN No results found for: CHOL, TRIG, HDL, CHOLHDL, VLDL, LDLCALC Lab Results  Component Value Date   TSH 0.69 08/28/2018    Therapeutic Level Labs: No results found for: LITHIUM No results found for: VALPROATE No components found for:  CBMZ  Current Medications: Current Outpatient Medications  Medication Sig Dispense Refill   buPROPion (WELLBUTRIN XL) 150 MG 24 hr tablet Take 1 tablet (150 mg total) by mouth daily. 30 tablet 1   amLODipine (NORVASC) 5 MG tablet TAKE 1 TABLET BY MOUTH DAILY WITH OLMERSARTAN     ARIPiprazole (ABILIFY) 20 MG tablet Take 1 tablet (20 mg total) by mouth daily. 90 tablet 0   escitalopram (LEXAPRO) 20 MG tablet Take 1 tablet (20 mg total) by mouth daily. 90 tablet 0   hydrochlorothiazide (MICROZIDE) 12.5 MG capsule Take 12.5 mg by mouth daily.     ibuprofen (ADVIL,MOTRIN) 200 MG tablet Take 200 mg by mouth every 6 (six) hours as needed for mild pain or moderate pain.     olmesartan (BENICAR) 20 MG tablet TAKE 1 TABLET BY MOUTH DAILY FOR BLOOD PRESSURE WITH AMLODIPINE     traZODone (DESYREL) 50 MG tablet Take 1 tablet (50 mg total) by mouth at bedtime as needed for sleep. 90 tablet 0   No current facility-administered medications for this visit.     Musculoskeletal: Strength & Muscle Tone:  N/A Gait & Station:  N/A Patient leans: N/A  Psychiatric Specialty Exam: Review of Systems  Psychiatric/Behavioral:  Positive for decreased concentration, dysphoric mood and sleep disturbance. Negative for agitation, behavioral problems, confusion, hallucinations, self-injury and suicidal ideas. The patient is nervous/anxious. The patient is not hyperactive.   All other systems reviewed and are negative.  There were no vitals taken for this visit.There is no height or weight on file to calculate BMI.  General Appearance: Fairly Groomed  Eye Contact:  Good  Speech:  Clear and Coherent  Volume:  Normal  Mood:   uneasy  Affect:  Appropriate, Congruent,  Tearful, and down at times  Thought Process:  Coherent  Orientation:  Full (Time, Place, and Person)  Thought Content: Logical   Suicidal Thoughts:  No  Homicidal Thoughts:  No  Memory:  Immediate;   Good  Judgement:  Good  Insight:  Good  Psychomotor Activity:  Normal  Concentration:  Concentration: Good and Attention Span: Good  Recall:  Good  Fund of Knowledge: Good  Language: Good  Akathisia:  No  Handed:  Right  AIMS (if indicated): not done  Assets:  Communication Skills Desire for Improvement  ADL's:  Intact  Cognition: WNL  Sleep:  Poor   Screenings: PHQ2-9  Flowsheet Row Video Visit from confidential encounter on 03/10/2021 Video Visit from confidential encounter on 11/03/2020 Video Visit from confidential encounter on 09/17/2020  PHQ-2 Total Score 2 2 2   PHQ-9 Total Score 11 11 12       Flowsheet Row Video Visit from confidential encounter on 03/10/2021 Video Visit from confidential encounter on 01/14/2021 Video Visit from confidential encounter on 11/03/2020  C-SSRS RISK CATEGORY Error: Q3, 4, or 5 should not be populated when Q2 is No Low Risk Error: Q3, 4, or 5 should not be populated when Q2 is No        Assessment and Plan:  Troy Mahoney is a 30 y.o. year old male with a history of depression, hypertension, who presents for follow up appointment for below.   1. Recurrent moderate major depressive disorder with anxiety (HCC) 2. Borderline personality disorder (HCC) He continues to report depressive symptoms in the context of termination of job, and the recent episode of bringing his aunt to the hospital, which reminds him of many losses through his life.  Other psychosocial stressors includes future career prospects, being a caregiver of his mother, conflict with his sister.  He is actively looking for a job, and is willing to continue to see a therapist.  We will start bupropion to target depression.  Discussed potential risk of headache and insomnia.  He has no  known history of seizure.  Will continue Lexapro to target depression.  Will continue Abilify adjunctive treatment for depression and mood dysregulation.   3. Insomnia, unspecified type He started to have middle insomnia again.  Will restart trazodone given he had a good benefit in the past.  Noted that although referral was made for sleep evaluation due to snoring and fatigue, he is unable to make it due to lack of transportation at this time.   This clinician has discussed the side effect associated with medication prescribed during this encounter. Please refer to notes in the previous encounters for more details.    Plan I have reviewed and updated plans as below  1. Continue lexapro 20 mg daily   2   Continue Abilify 20 mg daily 3. Start bupropion 150 mg daily 4. Start Trazodone 25 -50 mg at night as needed  5. Next appointment:  10/19 at 11:30 for 30 mins , video  [email or web address] - he rarely takes Trazodone    Past trials of medication: sertraline (insomnia) , bupropion, lamotrigine (weight gain)  I have reviewed suicide assessment in detail. No change in the following assessment.                The patient demonstrates the following risk factors for suicide: Chronic risk factors for suicide include: psychiatric disorder of depression. Acute risk factors for suicide include: loss (financial, interpersonal, professional). Protective factors for this patient include: responsibility to others (children, family), coping skills and hope for the future. Considering these factors, the overall suicide risk at this point appears to be low. He denies gun access at home. Patient is appropriate for outpatient follow up.        37, MD 03/10/2021, 12:07 PM

## 2021-03-10 ENCOUNTER — Other Ambulatory Visit: Payer: Self-pay

## 2021-03-10 ENCOUNTER — Telehealth (INDEPENDENT_AMBULATORY_CARE_PROVIDER_SITE_OTHER): Payer: Self-pay | Admitting: Psychiatry

## 2021-03-10 ENCOUNTER — Encounter: Payer: Self-pay | Admitting: Psychiatry

## 2021-03-10 DIAGNOSIS — F419 Anxiety disorder, unspecified: Secondary | ICD-10-CM

## 2021-03-10 DIAGNOSIS — G4700 Insomnia, unspecified: Secondary | ICD-10-CM

## 2021-03-10 DIAGNOSIS — F603 Borderline personality disorder: Secondary | ICD-10-CM

## 2021-03-10 DIAGNOSIS — F331 Major depressive disorder, recurrent, moderate: Secondary | ICD-10-CM

## 2021-03-10 MED ORDER — TRAZODONE HCL 50 MG PO TABS: 50.0000 mg | ORAL_TABLET | Freq: Every evening | ORAL | 0 refills | Status: DC | PRN

## 2021-03-10 MED ORDER — BUPROPION HCL ER (XL) 150 MG PO TB24: 150.0000 mg | ORAL_TABLET | Freq: Every day | ORAL | 1 refills | Status: DC

## 2021-03-10 NOTE — Patient Instructions (Signed)
1. Continue lexapro 20 mg daily   2   Continue Abilify 20 mg daily 3. Start bupropion 150 mg daily 4. Start Trazodone 25 -50 mg at night as needed  5. Next appointment:  10/19 at 11:30

## 2021-04-15 NOTE — Progress Notes (Signed)
Virtual Visit via Video Note  I connected with Troy Mahoney on 04/21/21 at 11:30 AM EDT by a video enabled telemedicine application and verified that I am speaking with the correct person using two identifiers.  Location: Patient: home Provider: office Persons participated in the visit- patient, provider    I discussed the limitations of evaluation and management by telemedicine and the availability of in person appointments. The patient expressed understanding and agreed to proceed.    I discussed the assessment and treatment plan with the patient. The patient was provided an opportunity to ask questions and all were answered. The patient agreed with the plan and demonstrated an understanding of the instructions.   The patient was advised to call back or seek an in-person evaluation if the symptoms worsen or if the condition fails to improve as anticipated.  I provided 17 minutes of non-face-to-face time during this encounter.   [name], MD    [facility] MD/PA/NP OP Progress Note  04/21/2021 11:56 AM Troy Mahoney  MRN:  [id number]  Chief Complaint:  Chief Complaint   Follow-up; Depression    HPI:  This is a follow-up appointment for depression.  He states that he is in limbo.  He has seen the progress since he lost his job.  He is questioning whether or not he would like to return to the same job.  He had made some research, and may be interested in going to school.  He is also interested in real estate.  However, he does not know what to do as he does not want to get overwhelmed again.  He would like to fix his life, referring that he lost his job and car.  He agrees that he may consider doing part-time job.  He was feeling horrible last week due to DUI he had in last September.  He was driving under the influence of alcohol.  He states that he never had issues with alcohol, and he denies any recent alcohol use.  He has been suspended driver's license for a year, which he  does not think is an issue as he does not have a car.  He has insomnia.  He feels positive this week.  He has difficulty in concentration.  He denies change in appetite.  Although he did have passive SI, he does not like it, and he does not have any plans or intent.  He now has crisis call phone number in his father.  He has not been able to start bupropion/trazodone due to issues with insurance.  He is willing to try these medications this time.   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 Recurrent moderate major depressive disorder with anxiety (HCC)  F33.1    F41.9     2. Borderline personality disorder (HCC)  F60.3     3. Insomnia, unspecified type  G47.00       Past Psychiatric History: Please see initial evaluation for full details. I have reviewed the history. No updates at this time.     Past Medical History: No past medical history on file. No past surgical history on file.  Family Psychiatric History: Please see initial evaluation for full details. I have reviewed the history. No updates at this time.     Family History:  Family History  Problem Relation Age  of Onset   Anxiety disorder Mother    Depression Mother    Anxiety disorder Maternal Aunt    Anxiety disorder Maternal Grandmother     Social History:  Social History   Socioeconomic History   Marital status: Single    Spouse name: Not on file   Number of children: Not on file   Years of education: Not on file   Highest education level: Not on file  Occupational History   Not on file  Tobacco Use   Smoking status: Never   Smokeless tobacco: Never  Substance and Sexual Activity   Alcohol use: Yes    Comment: occ.   Drug use: No   Sexual activity: Not on file  Other Topics Concern   Not on file  Social History Narrative   Not on file   Social  Determinants of Health   Financial Resource Strain: Not on file  Food Insecurity: Not on file  Transportation Needs: Not on file  Physical Activity: Not on file  Stress: Not on file  Social Connections: Not on file    Allergies: No Known Allergies  Metabolic Disorder Labs: No results found for: HGBA1C, MPG No results found for: PROLACTIN No results found for: CHOL, TRIG, HDL, CHOLHDL, VLDL, LDLCALC Lab Results  Component Value Date   TSH 0.69 08/28/2018    Therapeutic Level Labs: No results found for: LITHIUM No results found for: VALPROATE No components found for:  CBMZ  Current Medications: Current Outpatient Medications  Medication Sig Dispense Refill   amLODipine (NORVASC) 5 MG tablet TAKE 1 TABLET BY MOUTH DAILY WITH OLMERSARTAN     [START ON 05/04/2021] ARIPiprazole (ABILIFY) 20 MG tablet Take 1 tablet (20 mg total) by mouth daily. 90 tablet 0   buPROPion (WELLBUTRIN XL) 150 MG 24 hr tablet Take 1 tablet (150 mg total) by mouth daily. 30 tablet 1   [START ON 05/21/2021] escitalopram (LEXAPRO) 20 MG tablet Take 1 tablet (20 mg total) by mouth daily. 90 tablet 0   hydrochlorothiazide (MICROZIDE) 12.5 MG capsule Take 12.5 mg by mouth daily.     ibuprofen (ADVIL,MOTRIN) 200 MG tablet Take 200 mg by mouth every 6 (six) hours as needed for mild pain or moderate pain.     olmesartan (BENICAR) 20 MG tablet TAKE 1 TABLET BY MOUTH DAILY FOR BLOOD PRESSURE WITH AMLODIPINE     traZODone (DESYREL) 50 MG tablet Take 1 tablet (50 mg total) by mouth at bedtime as needed for sleep. 90 tablet 0   No current facility-administered medications for this visit.     Musculoskeletal: Strength & Muscle Tone:  N/A Gait & Station:  N/A Patient leans: N/A  Psychiatric Specialty Exam: Review of Systems  Psychiatric/Behavioral:  Positive for dysphoric mood, sleep disturbance and suicidal ideas. Negative for agitation, behavioral problems, confusion, decreased concentration, hallucinations and  self-injury. The patient is nervous/anxious. The patient is not hyperactive.   All other systems reviewed and are negative.  There were no vitals taken for this visit.There is no height or weight on file to calculate BMI.  General Appearance: Fairly Groomed  Eye Contact:  Good  Speech:  Clear and Coherent  Volume:  Normal  Mood:   positive  Affect:  Appropriate, Congruent, and calm  Thought Process:  Coherent  Orientation:  Full (Time, Place, and Person)  Thought Content: Logical   Suicidal Thoughts:  No  Homicidal Thoughts:  No  Memory:  Immediate;   Good  Judgement:  Good  Insight:  Good  Psychomotor Activity:  Normal  Concentration:  Concentration: Good and Attention Span: Good  Recall:  Good  Fund of Knowledge: Good  Language: Good  Akathisia:  No  Handed:  Right  AIMS (if indicated): not done  Assets:  Communication Skills Desire for Improvement  ADL's:  Intact  Cognition: WNL  Sleep:  Poor   Screenings: PHQ2-9    Flowsheet Row Video Visit from confidential encounter on 03/10/2021 Video Visit from confidential encounter on 11/03/2020 Video Visit from confidential encounter on 09/17/2020  PHQ-2 Total Score 2 2 2   PHQ-9 Total Score 11 11 12       Flowsheet Row Video Visit from confidential encounter on 03/10/2021 Video Visit from confidential encounter on 01/14/2021 Video Visit from confidential encounter on 11/03/2020  C-SSRS RISK CATEGORY Error: Q3, 4, or 5 should not be populated when Q2 is No Low Risk Error: Q3, 4, or 5 should not be populated when Q2 is No        Assessment and Plan:  BECKHAM CAPISTRAN is a 30 y.o. year old male with a history of depression, hypertension, who presents for follow up appointment for below.     1. Recurrent moderate major depressive disorder with anxiety (HCC) 2. Borderline personality disorder (HCC) He continues to report depressive symptoms with occasional SI.  Psychosocial stressors includes unemployment, recent episode of bringing  his aunt to the hospital, which reminds him of many losses through his life.  Other psychosocial stressors includes future career prospects, being a caregiver of his mother, conflict with his sister.  He has started to engage in daily activity, and is actively considering for the future job.  He has not been able to start bupropion due to issues with insurance.  He is willing to try bupropion to target depression.  Will continue Lexapro to target depression.  Will continue Abilify adjunctive treatment for depression and mood dysregulation.   3. Insomnia, unspecified type He continues to have middle insomnia.  Will restart trazodone as needed for insomnia.  He is now open to contact the clinic for sleep evaluation given his snoring, fatigue.   This clinician has discussed the side effect associated with medication prescribed during this encounter. Please refer to notes in the previous encounters for more details.     Plan I have reviewed and updated plans as below  1. Continue lexapro 20 mg daily   2   Continue Abilify 20 mg daily 3. Start bupropion 150 mg daily 4. Start Trazodone 25 -50 mg at night as needed  5. Next appointment:  12/13 at 3:30 for 30 mins , video  [email or web address] - he rarely takes Trazodone    Past trials of medication: sertraline (insomnia) , bupropion, lamotrigine (weight gain)              I have reviewed suicide assessment in detail. No change in the following assessment.    The patient demonstrates the following risk factors for suicide: Chronic risk factors for suicide include: psychiatric disorder of depression. Acute risk factors for suicide include: loss (financial, interpersonal, professional). Protective factors for this patient include: responsibility to others (children, family), coping skills and hope for the future. Considering these factors, the overall suicide risk at this point appears to be low. He denies gun access at home. Patient is appropriate for  outpatient follow up.    37, MD 04/21/2021, 11:56 AM

## 2021-04-21 ENCOUNTER — Encounter: Payer: Self-pay | Admitting: Psychiatry

## 2021-04-21 ENCOUNTER — Telehealth (INDEPENDENT_AMBULATORY_CARE_PROVIDER_SITE_OTHER): Payer: Self-pay | Admitting: Psychiatry

## 2021-04-21 ENCOUNTER — Other Ambulatory Visit: Payer: Self-pay

## 2021-04-21 DIAGNOSIS — F419 Anxiety disorder, unspecified: Secondary | ICD-10-CM

## 2021-04-21 DIAGNOSIS — G4700 Insomnia, unspecified: Secondary | ICD-10-CM

## 2021-04-21 DIAGNOSIS — F603 Borderline personality disorder: Secondary | ICD-10-CM

## 2021-04-21 DIAGNOSIS — F331 Major depressive disorder, recurrent, moderate: Secondary | ICD-10-CM

## 2021-04-21 MED ORDER — ESCITALOPRAM OXALATE 20 MG PO TABS: 20.0000 mg | ORAL_TABLET | Freq: Every day | ORAL | 0 refills | Status: DC

## 2021-04-21 MED ORDER — ARIPIPRAZOLE 20 MG PO TABS: 20.0000 mg | ORAL_TABLET | Freq: Every day | ORAL | 0 refills | Status: DC

## 2021-04-21 NOTE — Patient Instructions (Signed)
1. Continue lexapro 20 mg daily   2   Continue Abilify 20 mg daily 3. Start bupropion 150 mg daily 4. Start Trazodone 25 -50 mg at night as needed  5. Next appointment:  12/13 at 3:30

## 2021-06-10 NOTE — Progress Notes (Signed)
Virtual Visit via Video Note  I connected with Troy Mahoney on 06/15/21 at  3:30 PM EST by a video enabled telemedicine application and verified that I am speaking with the correct person using two identifiers.  Location: Patient: home Provider: office Persons participated in the visit- patient, provider    I discussed the limitations of evaluation and management by telemedicine and the availability of in person appointments. The patient expressed understanding and agreed to proceed.    I discussed the assessment and treatment plan with the patient. The patient was provided an opportunity to ask questions and all were answered. The patient agreed with the plan and demonstrated an understanding of the instructions.   The patient was advised to call back or seek an in-person evaluation if the symptoms worsen or if the condition fails to improve as anticipated.  I provided 16 minutes of non-face-to-face time during this encounter.   [name], MD    [facility] MD/PA/NP OP Progress Note  06/15/2021 3:57 PM Troy Mahoney  MRN:  [id number]  Chief Complaint:  Chief Complaint   Follow-up; Depression    HPI:  This is a follow-up appointment for depression and then insomnia.  He states that things has been hectic.  His mother had kidney transplant about a month ago.  He has been a caregiver for her.  He has been very busy, bringing her to the appointment, cooking and cleaning.  He is also concerned about her, who does not appear to do things well.  He thinks she may be depressed.  She has a psychiatrist, and has an upcoming appointment.  He has been a "caregiver mode" and has insomnia.  He tearfully states that he does not want to lose his mother.  He feels that something bad is going to happen.  He agrees to have self compassion.  He also agrees to try taking a walk regularly for him to have time for himself.  He feels fatigue.  He gained back his weight due to stress eating.  He has been  working on diet lately.  He tends to be forgetful, and has not been able to take his medication consistently.  He denies SI, although he thinks about the time he had SI last year, wishing that he does not feel that way anymore.  He feels anxious and tense.  He denies alcohol use or drug use.  He feels comfortable to stay on the medication as it is for now.   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 Recurrent moderate major depressive disorder with anxiety (HCC)  F33.1    F41.9     2. Borderline personality disorder (HCC)  F60.3     3. Insomnia, unspecified type  G47.00       Past Psychiatric History: Please see initial evaluation for full details. I have reviewed the history. No updates at this time.     Past Medical History: No past medical history on file. No past surgical history on file.  Family Psychiatric History: Please see initial evaluation for full details. I have reviewed the history. No updates at this time.     Family History:  Family History  Problem Relation Age of Onset   Anxiety disorder Mother    Depression Mother    Anxiety disorder Maternal Aunt    Anxiety disorder Maternal  Grandmother     Social History:  Social History   Socioeconomic History   Marital status: Single    Spouse name: Not on file   Number of children: Not on file   Years of education: Not on file   Highest education level: Not on file  Occupational History   Not on file  Tobacco Use   Smoking status: Never   Smokeless tobacco: Never  Substance and Sexual Activity   Alcohol use: Yes    Comment: occ.   Drug use: No   Sexual activity: Not on file  Other Topics Concern   Not on file  Social History Narrative   Not on file   Social Determinants of Health   Financial Resource Strain: Not on file  Food Insecurity: Not on file  Transportation  Needs: Not on file  Physical Activity: Not on file  Stress: Not on file  Social Connections: Not on file    Allergies: No Known Allergies  Metabolic Disorder Labs: No results found for: HGBA1C, MPG No results found for: PROLACTIN No results found for: CHOL, TRIG, HDL, CHOLHDL, VLDL, LDLCALC Lab Results  Component Value Date   TSH 0.69 08/28/2018    Therapeutic Level Labs: No results found for: LITHIUM No results found for: VALPROATE No components found for:  CBMZ  Current Medications: Current Outpatient Medications  Medication Sig Dispense Refill   amLODipine (NORVASC) 5 MG tablet TAKE 1 TABLET BY MOUTH DAILY WITH OLMERSARTAN     ARIPiprazole (ABILIFY) 20 MG tablet Take 1 tablet (20 mg total) by mouth daily. 90 tablet 0   buPROPion (WELLBUTRIN XL) 150 MG 24 hr tablet Take 1 tablet (150 mg total) by mouth daily. 30 tablet 0   escitalopram (LEXAPRO) 20 MG tablet Take 1 tablet (20 mg total) by mouth daily. 90 tablet 0   hydrochlorothiazide (MICROZIDE) 12.5 MG capsule Take 12.5 mg by mouth daily.     ibuprofen (ADVIL,MOTRIN) 200 MG tablet Take 200 mg by mouth every 6 (six) hours as needed for mild pain or moderate pain.     olmesartan (BENICAR) 20 MG tablet TAKE 1 TABLET BY MOUTH DAILY FOR BLOOD PRESSURE WITH AMLODIPINE     traZODone (DESYREL) 50 MG tablet Take 1 tablet (50 mg total) by mouth at bedtime as needed for sleep. 90 tablet 0   No current facility-administered medications for this visit.     Musculoskeletal: Strength & Muscle Tone:  N/A Gait & Station:  N/A Patient leans: N/A  Psychiatric Specialty Exam: Review of Systems  Psychiatric/Behavioral:  Positive for decreased concentration, dysphoric mood and sleep disturbance. Negative for agitation, behavioral problems, confusion, hallucinations, self-injury and suicidal ideas. The patient is nervous/anxious. The patient is not hyperactive.   All other systems reviewed and are negative.  There were no vitals taken for  this visit.There is no height or weight on file to calculate BMI.  General Appearance: Fairly Groomed  Eye Contact:  Good  Speech:  Clear and Coherent  Volume:  Normal  Mood:  Anxious  Affect:  Appropriate, Congruent, and Tearful  Thought Process:  Coherent  Orientation:  Full (Time, Place, and Person)  Thought Content: Logical   Suicidal Thoughts:  No  Homicidal Thoughts:  No  Memory:  Immediate;   Good  Judgement:  Good  Insight:  Good  Psychomotor Activity:  Normal  Concentration:  Concentration: Good and Attention Span: Good  Recall:  Good  Fund of Knowledge: Good  Language: Good  Akathisia:  No  Handed:  Right  AIMS (if indicated): not done  Assets:  Communication Skills Desire for Improvement  ADL's:  Intact  Cognition: WNL  Sleep:  Poor   Screenings: PHQ2-9    Flowsheet Row Video Visit from confidential encounter on 03/10/2021 Video Visit from confidential encounter on 11/03/2020 Video Visit from confidential encounter on 09/17/2020  PHQ-2 Total Score 2 2 2   PHQ-9 Total Score 11 11 12       Flowsheet Row Video Visit from confidential encounter on 03/10/2021 Video Visit from confidential encounter on 01/14/2021 Video Visit from confidential encounter on 11/03/2020  C-SSRS RISK CATEGORY Error: Q3, 4, or 5 should not be populated when Q2 is No Low Risk Error: Q3, 4, or 5 should not be populated when Q2 is No        Assessment and Plan:  Troy Mahoney is a 30 y.o. year old male with a history of depression, hypertension, who presents for follow up appointment for below.    1. Recurrent moderate major depressive disorder with anxiety (HCC) 2. Borderline personality disorder (HCC) He reports slight worsening in depressive symptoms and anxiety in the context of being a caregiver of his mother, who recently had a kidney transplant.  Other psychosocial stressors includes unemployment, recent episode of bringing his aunt to the hospital, which reminds him of many losses through  his life, and conflict with his sister.  He has not been able to take medication consistently; explore the way he can be adherent to treatment.  Will continue current medication regimen at this time.  Will continue Lexapro, bupropion, and Abilify to target depression.   3. Insomnia, unspecified type Worsening in the context of being a caregiver of his mother.  Will continue trazodone as needed for insomnia.  He was advised to contact the clinic for sleep evaluation given his snoring and fatigue at the previous visit.    This clinician has discussed the side effect associated with medication prescribed during this encounter. Please refer to notes in the previous encounters for more details.       Plan I have reviewed and updated plans as below  Continue lexapro 20 mg daily  Continue bupropion 150 mg daily Continue Abilify 20 mg daily Continue trazodone 25 -50 mg at night as needed  5. Next appointment:  1/11 at 10:30 for 30 mins , video  [email or web address] - he rarely takes Trazodone    Past trials of medication: sertraline (insomnia) , bupropion, lamotrigine (weight gain)               The patient demonstrates the following risk factors for suicide: Chronic risk factors for suicide include: psychiatric disorder of depression. Acute risk factors for suicide include: loss (financial, interpersonal, professional). Protective factors for this patient include: responsibility to others (children, family), coping skills and hope for the future. Considering these factors, the overall suicide risk at this point appears to be low. He denies gun access at home. Patient is appropriate for outpatient follow up.  37, MD 06/15/2021, 3:57 PM

## 2021-06-15 ENCOUNTER — Encounter: Payer: Self-pay | Admitting: Psychiatry

## 2021-06-15 ENCOUNTER — Telehealth (INDEPENDENT_AMBULATORY_CARE_PROVIDER_SITE_OTHER): Payer: Self-pay | Admitting: Psychiatry

## 2021-06-15 ENCOUNTER — Other Ambulatory Visit: Payer: Self-pay

## 2021-06-15 DIAGNOSIS — F419 Anxiety disorder, unspecified: Secondary | ICD-10-CM

## 2021-06-15 DIAGNOSIS — F331 Major depressive disorder, recurrent, moderate: Secondary | ICD-10-CM

## 2021-06-15 DIAGNOSIS — F603 Borderline personality disorder: Secondary | ICD-10-CM

## 2021-06-15 DIAGNOSIS — G4700 Insomnia, unspecified: Secondary | ICD-10-CM

## 2021-06-15 MED ORDER — BUPROPION HCL ER (XL) 150 MG PO TB24: 150.0000 mg | ORAL_TABLET | Freq: Every day | ORAL | 0 refills | Status: DC

## 2021-06-15 NOTE — Patient Instructions (Signed)
Continue lexapro 20 mg daily  Continue bupropion 150 mg daily Continue Abilify 20 mg daily Continue trazodone 25 -50 mg at night as needed  5. Next appointment:  1/11 at 10:30, video

## 2021-07-13 NOTE — Progress Notes (Signed)
Virtual Visit via Video Note  I connected with Troy Mahoney on 07/14/21 at 10:30 AM EST by a video enabled telemedicine application and verified that I am speaking with the correct person using two identifiers.  Location: Patient: car Provider: office Persons participated in the visit- patient, provider    I discussed the limitations of evaluation and management by telemedicine and the availability of in person appointments. The patient expressed understanding and agreed to proceed.    I discussed the assessment and treatment plan with the patient. The patient was provided an opportunity to ask questions and all were answered. The patient agreed with the plan and demonstrated an understanding of the instructions.   The patient was advised to call back or seek an in-person evaluation if the symptoms worsen or if the condition fails to improve as anticipated.  I provided 18 minutes of non-face-to-face time during this encounter.   Neysa Hottereina Latonya Knight, MD    [facility] MD/PA/NP OP Progress Note  07/14/2021 11:01 AM Troy Mahoney:  [id number]  Chief Complaint:  Chief Complaint   Follow-up; Depression    HPI:  This is a follow-up appointment for depression.  He states that he is not feeling the best.  His mother went to the hospital.  It triggered him a deep depression, although he does not know why.  He was unable to get out of the bed or do daily routine.  Validated his feeling given his fear of losing his mother.  He also reports frustration that his mother is trying to withhold information to her medical provider and to her relatives.  He feels angry about the situation.  He feels very overwhelmed.  He also needs to take care of her dog on top of trying to take care of himself. He has been trying to stay positive, doing house chores.  Although he has occasional fleeting SI, he denies any plan or intent, and trying to keep him away from these thoughts.  He agrees to contact emergency  resources if any worsening.  He states that he has crisis call numbers in his phone in case he needs it.  He has depressive symptoms as in PHQ-9.  He agrees to try higher dose of bupropion at this time.  He also agrees to contact [facility] to make an appointment with Aurther Lofterry.    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 Recurrent moderate major depressive disorder with anxiety (HCC)  F33.1    F41.9     2. Borderline personality disorder (HCC)  F60.3     3. Insomnia, unspecified type  G47.00       Past Psychiatric History: Please see initial evaluation for full details. I have reviewed the history. No updates at this time.     Past Medical History: No past medical history on file. No past surgical history on file.  Family Psychiatric History: Please see initial evaluation for full details. I have reviewed the history. No updates at this time.     Family History:  Family History  Problem Relation Age of Onset   Anxiety disorder Mother    Depression Mother    Anxiety disorder Maternal Aunt    Anxiety disorder Maternal Grandmother     Social History:  Social History   Socioeconomic History   Marital status: Single  Spouse name: Not on file   Number of children: Not on file   Years of education: Not on file   Highest education level: Not on file  Occupational History   Not on file  Tobacco Use   Smoking status: Never   Smokeless tobacco: Never  Substance and Sexual Activity   Alcohol use: Yes    Comment: occ.   Drug use: No   Sexual activity: Not on file  Other Topics Concern   Not on file  Social History Narrative   Not on file   Social Determinants of Health   Financial Resource Strain: Not on file  Food Insecurity: Not on file  Transportation Needs: Not on file  Physical Activity: Not on file  Stress: Not on file   Social Connections: Not on file    Allergies: No Known Allergies  Metabolic Disorder Labs: No results found for: HGBA1C, MPG No results found for: PROLACTIN No results found for: CHOL, TRIG, HDL, CHOLHDL, VLDL, LDLCALC Lab Results  Component Value Date   TSH 0.69 08/28/2018    Therapeutic Level Labs: No results found for: LITHIUM No results found for: VALPROATE No components found for:  CBMZ  Current Medications: Current Outpatient Medications  Medication Sig Dispense Refill   amLODipine (NORVASC) 5 MG tablet TAKE 1 TABLET BY MOUTH DAILY WITH OLMERSARTAN     [START ON 08/02/2021] ARIPiprazole (ABILIFY) 20 MG tablet Take 1 tablet (20 mg total) by mouth daily. 90 tablet 0   buPROPion (WELLBUTRIN XL) 300 MG 24 hr tablet Take 1 tablet (300 mg total) by mouth daily. 30 tablet 1   escitalopram (LEXAPRO) 20 MG tablet Take 1 tablet (20 mg total) by mouth daily. 90 tablet 0   hydrochlorothiazide (MICROZIDE) 12.5 MG capsule Take 12.5 mg by mouth daily.     ibuprofen (ADVIL,MOTRIN) 200 MG tablet Take 200 mg by mouth every 6 (six) hours as needed for mild pain or moderate pain.     olmesartan (BENICAR) 20 MG tablet TAKE 1 TABLET BY MOUTH DAILY FOR BLOOD PRESSURE WITH AMLODIPINE     traZODone (DESYREL) 50 MG tablet Take 1 tablet (50 mg total) by mouth at bedtime as needed for sleep. 90 tablet 0   No current facility-administered medications for this visit.     Musculoskeletal: Strength & Muscle Tone:  N/A Gait & Station:  N/A Patient leans: N/A  Psychiatric Specialty Exam: Review of Systems  Psychiatric/Behavioral:  Positive for decreased concentration, dysphoric mood, sleep disturbance and suicidal ideas. Negative for agitation, behavioral problems, confusion, hallucinations and self-injury. The patient is nervous/anxious. The patient is not hyperactive.   All other systems reviewed and are negative.  There were no vitals taken for this visit.There is no height or weight on file to  calculate BMI.  General Appearance: Fairly Groomed  Eye Contact:  Good  Speech:  Clear and Coherent  Volume:  Normal  Mood:  Depressed  Affect:  Appropriate, Congruent, Tearful, and down  Thought Process:  Coherent  Orientation:  Full (Time, Place, and Person)  Thought Content: Logical   Suicidal Thoughts:  Yes.  without intent/plan  Homicidal Thoughts:  No  Memory:  Immediate;   Good  Judgement:  Good  Insight:  Good  Psychomotor Activity:  Normal  Concentration:  Concentration: Good and Attention Span: Good  Recall:  Good  Fund of Knowledge: Good  Language: Good  Akathisia:  No  Handed:  Right  AIMS (if indicated): not done  Assets:  Communication Skills Desire  for Improvement  ADL's:  Intact  Cognition: WNL  Sleep:  Poor   Screenings: PHQ2-9    Flowsheet Row Video Visit from confidential encounter on 07/14/2021 Video Visit from confidential encounter on 03/10/2021 Video Visit from confidential encounter on 11/03/2020 Video Visit from confidential encounter on 09/17/2020  PHQ-2 Total Score 4 2 2 2   PHQ-9 Total Score 19 11 11 12       Flowsheet Row Video Visit from confidential encounter on 07/14/2021 Video Visit from confidential encounter on 03/10/2021 Video Visit from confidential encounter on 01/14/2021  C-SSRS RISK CATEGORY Error: Q3, 4, or 5 should not be populated when Q2 is No Error: Q3, 4, or 5 should not be populated when Q2 is No Low Risk        Assessment and Plan:  JAYANTH SZCZESNIAK is a 31 y.o. year old male with a history of depression, hypertension, who presents for follow up appointment for below.   1. Recurrent moderate major depressive disorder with anxiety (HCC) 2. Borderline personality disorder (HCC) There has been significant worsening in depressive symptoms and anxiety in the context of his mother being admitted to the hospital. Other psychosocial stressors includes unemployment, recent episode of bringing his aunt to the hospital, which reminds him of  many losses through his life, and conflict with his sister.  We do further up titration of bupropion to optimize treatment for depression.  He has no known history of seizure.  Will continue Lexapro and Abilify to target depression.   3. Insomnia, unspecified type Worsening.  He has not been able to contact for sleep evaluation due to current situation with his mother.  He agrees to contact the clinic when the situation is settled.  Will continue trazodone as needed for insomnia.    This clinician has discussed the side effect associated with medication prescribed during this encounter. Please refer to notes in the previous encounters for more details.    Plan I have reviewed and updated plans as below  Continue lexapro 20 mg daily  Increase bupropion 300 mg daily Continue Abilify 20 mg daily Continue trazodone 25 -50 mg at night as needed  5. Next appointment:  2/13 at 11:30 for 30 mins, video, and 3/13 at 10 AM for in person   [email or web address] - he rarely takes Trazodone    Past trials of medication: sertraline (insomnia) , bupropion, lamotrigine (weight gain)              I have reviewed suicide assessment in detail as below.  The patient demonstrates the following risk factors for suicide: Chronic risk factors for suicide include: psychiatric disorder of depression. Acute risk factors for suicide include: loss (financial, interpersonal, professional). Protective factors for this patient include: responsibility to others (children, family), coping skills and hope for the future. Considering these factors, the overall suicide risk at this point appears to be moderate, but not at imminent risk. He denies gun access at home. Patient is appropriate for outpatient follow up.     3/13, MD 07/14/2021, 11:01 AM

## 2021-07-14 ENCOUNTER — Other Ambulatory Visit: Payer: Self-pay

## 2021-07-14 ENCOUNTER — Encounter: Payer: Self-pay | Admitting: Psychiatry

## 2021-07-14 ENCOUNTER — Telehealth (INDEPENDENT_AMBULATORY_CARE_PROVIDER_SITE_OTHER): Payer: Self-pay | Admitting: Psychiatry

## 2021-07-14 DIAGNOSIS — F603 Borderline personality disorder: Secondary | ICD-10-CM

## 2021-07-14 DIAGNOSIS — F419 Anxiety disorder, unspecified: Secondary | ICD-10-CM

## 2021-07-14 DIAGNOSIS — F331 Major depressive disorder, recurrent, moderate: Secondary | ICD-10-CM

## 2021-07-14 DIAGNOSIS — G4700 Insomnia, unspecified: Secondary | ICD-10-CM

## 2021-07-14 MED ORDER — BUPROPION HCL ER (XL) 300 MG PO TB24: 300.0000 mg | ORAL_TABLET | Freq: Every day | ORAL | 1 refills | Status: DC

## 2021-07-14 MED ORDER — TRAZODONE HCL 50 MG PO TABS: 50.0000 mg | ORAL_TABLET | Freq: Every evening | ORAL | 0 refills | Status: DC | PRN

## 2021-07-14 MED ORDER — ARIPIPRAZOLE 20 MG PO TABS: 20.0000 mg | ORAL_TABLET | Freq: Every day | ORAL | 0 refills | Status: DC

## 2021-07-14 NOTE — Patient Instructions (Signed)
Continue lexapro 20 mg daily  Increase bupropion 300 mg daily Continue Abilify 20 mg daily Continue trazodone 25 -50 mg at night as needed  5. Next appointment:  2/13 at 11:30 , video, and 3/13 at 10 AM for in person

## 2021-08-12 NOTE — Progress Notes (Signed)
Virtual Visit via Video Note  I connected with Troy Mahoney on 08/16/21 at 11:30 AM EST by a video enabled telemedicine application and verified that I am speaking with the correct person using two identifiers.  Location: Patient: home Provider: office Persons participated in the visit- patient, provider    I discussed the limitations of evaluation and management by telemedicine and the availability of in person appointments. The patient expressed understanding and agreed to proceed.   I discussed the assessment and treatment plan with the patient. The patient was provided an opportunity to ask questions and all were answered. The patient agreed with the plan and demonstrated an understanding of the instructions.   The patient was advised to call back or seek an in-person evaluation if the symptoms worsen or if the condition fails to improve as anticipated.  I provided 18 minutes of non-face-to-face time during this encounter.   [name], MD    [facility] MD/PA/NP OP Progress Note  08/16/2021 12:08 PM Job KALAI BACA  MRN:  [id number]  Chief Complaint:  Chief Complaint   Follow-up; Depression    HPI:  This is a follow-up appointment for depression.  He states that he has been doing better since up titration on bupropion.  He has been working on diet as he noticed weight gain.  He also has started for exercise a few times per week.  He is trying to be consistent in this.  His mother has been doing better except that she was in the hospital a few weeks ago.  She may return to work if that is approved at her next visit.  He states that the relationship with her has been getting better.  He is thinking about finding a job in the future, although he just looks at what is there at this time.  He agrees that he may make plans ahead to be prepared for this.  He has occasional insomnia.  He feels down at times.  He has difficulty in concentration.  He reports his frustration that although he  has some energy to do things, he tends to be distracted.  He denies SI.  He feels less anxious.  He denies panic attacks since the last visit.  He has erectile dysfunction, which he has noticed lately.  He is unsure if this is related to the medication.  He also states that he has diaphoresis since age 12, and wonders if psychotropics can cause the symptoms as well.  He prefers to stay on his current medication rather than trying to adjust due to these concerns.   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 Recurrent moderate major depressive disorder with anxiety (HCC)  F33.1    F41.9     2. Borderline personality disorder (HCC)  F60.3     3. Insomnia, unspecified type  G47.00       Past Psychiatric History: Please see initial evaluation for full details. I have reviewed the history. No updates at this time.     Past Medical History: No past medical history on file. No past surgical history on file.  Family Psychiatric History: Please see initial evaluation for full details.  I have reviewed the history. No updates at this time.     Family History:  Family History  Problem Relation Age of Onset   Anxiety disorder Mother    Depression Mother    Anxiety disorder Maternal Aunt    Anxiety disorder Maternal Grandmother     Social History:  Social History   Socioeconomic History   Marital status: Single    Spouse name: Not on file   Number of children: Not on file   Years of education: Not on file   Highest education level: Not on file  Occupational History   Not on file  Tobacco Use   Smoking status: Never   Smokeless tobacco: Never  Substance and Sexual Activity   Alcohol use: Yes    Comment: occ.    Drug use: No   Sexual activity: Not on file  Other Topics Concern   Not on file  Social History Narrative   Not on file   Social Determinants of Health   Financial Resource Strain: Not on file  Food Insecurity: Not on file  Transportation Needs: Not on file  Physical Activity: Not on file  Stress: Not on file  Social Connections: Not on file    Allergies: No Known Allergies  Metabolic Disorder Labs: No results found for: HGBA1C, MPG No results found for: PROLACTIN No results found for: CHOL, TRIG, HDL, CHOLHDL, VLDL, LDLCALC Lab Results  Component Value Date   TSH 0.69 08/28/2018    Therapeutic Level Labs: No results found for: LITHIUM No results found for: VALPROATE No components found for:  CBMZ  Current Medications: Current Outpatient Medications  Medication Sig Dispense Refill   amLODipine (NORVASC) 5 MG tablet TAKE 1 TABLET BY MOUTH DAILY WITH OLMERSARTAN     ARIPiprazole (ABILIFY) 20 MG tablet Take 1 tablet (20 mg total) by mouth daily. 90 tablet 0   buPROPion (WELLBUTRIN XL) 300 MG 24 hr tablet Take 1 tablet (300 mg total) by mouth daily. 30 tablet 1   [START ON 08/20/2021] escitalopram (LEXAPRO) 20 MG tablet Take 1 tablet (20 mg total) by mouth daily. 90 tablet 0   hydrochlorothiazide (MICROZIDE) 12.5 MG capsule Take 12.5 mg by mouth daily.     ibuprofen (ADVIL,MOTRIN) 200 MG tablet Take 200 mg by mouth every 6 (six) hours as needed for mild pain or moderate pain.     olmesartan (BENICAR) 20 MG tablet TAKE 1 TABLET BY MOUTH DAILY FOR BLOOD PRESSURE WITH AMLODIPINE     traZODone (DESYREL) 50 MG tablet Take 1 tablet (50 mg total) by mouth at bedtime as needed for sleep. 90 tablet 0   No current facility-administered medications for this visit.     Musculoskeletal: Strength & Muscle Tone:  N/A Gait & Station:  N/A Patient leans: N/A  Psychiatric Specialty Exam: Review of Systems  Psychiatric/Behavioral:  Positive for decreased concentration, dysphoric mood  and sleep disturbance. Negative for agitation, behavioral problems, confusion, hallucinations, self-injury and suicidal ideas. The patient is nervous/anxious. The patient is not hyperactive.   All other systems reviewed and are negative.  There were no vitals taken for this visit.There is no height or weight on file to calculate BMI.  General Appearance: Fairly Groomed  Eye Contact:  Good  Speech:  Clear and Coherent  Volume:  Normal  Mood:   better  Affect:  Appropriate, Congruent, and less tense, smiles  Thought Process:  Coherent  Orientation:  Full (Time, Place, and Person)  Thought Content: Logical   Suicidal  Thoughts:  No  Homicidal Thoughts:  No  Memory:  Immediate;   Good  Judgement:  Good  Insight:  Good  Psychomotor Activity:  Normal  Concentration:  Concentration: Good and Attention Span: Good  Recall:  Good  Fund of Knowledge: Good  Language: Good  Akathisia:  No  Handed:  Right  AIMS (if indicated): not done  Assets:  Communication Skills Desire for Improvement  ADL's:  Intact  Cognition: WNL  Sleep:  Fair   Screenings: PHQ2-9    Flowsheet Row Video Visit from confidential encounter on 07/14/2021 Video Visit from confidential encounter on 03/10/2021 Video Visit from confidential encounter on 11/03/2020 Video Visit from confidential encounter on 09/17/2020  PHQ-2 Total Score 4 2 2 2   PHQ-9 Total Score 19 11 11 12       Flowsheet Row Video Visit from confidential encounter on 07/14/2021 Video Visit from confidential encounter on 03/10/2021 Video Visit from confidential encounter on 01/14/2021  C-SSRS RISK CATEGORY Error: Q3, 4, or 5 should not be populated when Q2 is No Error: Q3, 4, or 5 should not be populated when Q2 is No Low Risk        Assessment and Plan:  ERMIAS TOMEO is a 31 y.o. year old male with a history of depression, hypertension, who presents for follow up appointment for below.   1. Recurrent moderate major depressive disorder with anxiety  (HCC) 2. Borderline personality disorder (HCC) There has been overall improvement in depressive symptoms and then anxiety since up titration of bupropion.  Recent psychosocial stressors includes his mother with medical condition. Other psychosocial stressors includes unemployment, recent episode of bringing his aunt to the hospital, which reminds him of many losses through his life, and conflict with his sister.  Will continue Lexapro, bupropion, and Abilify to target depression.  Noted that he reports erectile dysfunction, which he noticed lately, and diaphoresis (he experiences this since age 57).  Discussed potential risk of his psychotropic medication contributing to these symptoms.  Will continue to monitor.   3. Insomnia, unspecified type Improving. He has not been able to contact for sleep evaluation due to current situation with his mother.  Will continue current dose of trazodone as needed for insomnia.    This clinician has discussed the side effect associated with medication prescribed during this encounter. Please refer to notes in the previous encounters for more details.    Plan Continue lexapro 20 mg daily  Continue bupropion 300 mg daily Continue Abilify 20 mg daily Continue trazodone 25 -50 mg at night as needed  5. Next appointment:   3/13 at 10 AM for in person   [email or web address] (he will contact the clinic if unable to make it in person) - he rarely takes Trazodone    Past trials of medication: sertraline (insomnia) , bupropion, lamotrigine (weight gain)              The patient demonstrates the following risk factors for suicide: Chronic risk factors for suicide include: psychiatric disorder of depression. Acute risk factors for suicide include: loss (financial, interpersonal, professional). Protective factors for this patient include: responsibility to others (children, family), coping skills and hope for the future. Considering these factors, the overall suicide risk at  this point appears to be moderate, but not at imminent risk. He denies gun access at home. Patient is appropriate for outpatient follow up.    15, MD 08/16/2021, 12:08 PM

## 2021-08-16 ENCOUNTER — Encounter: Payer: Self-pay | Admitting: Psychiatry

## 2021-08-16 ENCOUNTER — Telehealth (INDEPENDENT_AMBULATORY_CARE_PROVIDER_SITE_OTHER): Payer: Self-pay | Admitting: Psychiatry

## 2021-08-16 ENCOUNTER — Other Ambulatory Visit: Payer: Self-pay

## 2021-08-16 DIAGNOSIS — G4700 Insomnia, unspecified: Secondary | ICD-10-CM

## 2021-08-16 DIAGNOSIS — F331 Major depressive disorder, recurrent, moderate: Secondary | ICD-10-CM

## 2021-08-16 DIAGNOSIS — F419 Anxiety disorder, unspecified: Secondary | ICD-10-CM

## 2021-08-16 DIAGNOSIS — F603 Borderline personality disorder: Secondary | ICD-10-CM

## 2021-08-16 MED ORDER — ESCITALOPRAM OXALATE 20 MG PO TABS: 20.0000 mg | ORAL_TABLET | Freq: Every day | ORAL | 0 refills | Status: DC

## 2021-08-16 NOTE — Patient Instructions (Signed)
Continue lexapro 20 mg daily  Continue bupropion 300 mg daily Continue Abilify 20 mg daily Continue trazodone 25 -50 mg at night as needed  5. Next appointment:   3/13 at 10 AM

## 2021-08-23 ENCOUNTER — Other Ambulatory Visit: Payer: Self-pay

## 2021-08-23 ENCOUNTER — Encounter (HOSPITAL_COMMUNITY): Payer: Self-pay

## 2021-08-23 ENCOUNTER — Ambulatory Visit (INDEPENDENT_AMBULATORY_CARE_PROVIDER_SITE_OTHER): Payer: Self-pay | Admitting: Clinical

## 2021-08-23 DIAGNOSIS — F331 Major depressive disorder, recurrent, moderate: Secondary | ICD-10-CM

## 2021-08-23 DIAGNOSIS — F419 Anxiety disorder, unspecified: Secondary | ICD-10-CM
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
## 2021-08-23 NOTE — Plan of Care (Signed)
Verbal Consent 

## 2021-09-07 ENCOUNTER — Telehealth: Payer: Self-pay | Admitting: Psychiatry

## 2021-09-07 NOTE — Telephone Encounter (Signed)
I see the paperwork from the [facility].  I believe he left his work, and is not currently employed.  Could you contact him and ask his current employment status/if this form needs to be filled. Thanks.  ?

## 2021-09-08 NOTE — Progress Notes (Signed)
Virtual Visit via Video Note  I connected with Troy Mahoney on 09/13/21 at 10:00 AM EDT by a video enabled telemedicine application and verified that I am speaking with the correct person using two identifiers.  Location: Patient: car Provider: office Persons participated in the visit- patient, provider    I discussed the limitations of evaluation and management by telemedicine and the availability of in person appointments. The patient expressed understanding and agreed to proceed.   I discussed the assessment and treatment plan with the patient. The patient was provided an opportunity to ask questions and all were answered. The patient agreed with the plan and demonstrated an understanding of the instructions.   The patient was advised to call back or seek an in-person evaluation if the symptoms worsen or if the condition fails to improve as anticipated.  I provided 21 minutes of non-face-to-face time during this encounter.   Troy Hottereina Jaleeah Slight, MD   [facility] MD/PA/NP OP Progress Note  09/13/2021 10:44 AM Troy Mahoney  MRN:  [id number]  Chief Complaint:  Chief Complaint  Patient presents with   Follow-up   Depression   HPI:  This is a follow-up appointment for depression and insomnia.  He states that he has been feeling overwhelmed.  He is currently waiting for his mother, who went to the hospital.  He feels anxious about going back to work [employer].  He has been trying to find the right fit.  He has been working with his therapist about working on flexibility as he tends to set expectation for him to be a failure.  He reports concern of him starting things but not be able to finish things.  He sees people on Facebook, and is wondering if he has ADHD.  He struggles with inattention since childhood.  He was unable to do homework, although he did very well on the test.  He agrees to hold evaluation of ADHD given his mood plays major role in his inattention.  He feels down the other day,  thinking about his father.  There is five year anniversary of loss of his father.  Normalized his grief.  He has started to enjoy gardening.  He feels fatigue and hypersomnia.  He snores at night.  He is willing to get sleep evaluation at this time.  He denies change in appetite.  Although he reports occasional passive SI, he denies any plan or intent.  He agrees to contact emergency resources if needed.  He denies alcohol use or drug use.  He takes medication regularly.  He is willing to try higher dose of bupropion.    Visit Diagnosis:    ICD-10-CM   1. Fatigue, unspecified type  R53.83 Ambulatory referral to Neurology    2. Moderate episode of recurrent major depressive disorder (HCC)  F33.1     3. Borderline personality disorder (HCC)  F60.3     4. Insomnia, unspecified type  G47.00       Past Psychiatric History: Please see initial evaluation for full details. I have reviewed the history. No updates at this time.     Past Medical History: No past medical history on file. No past surgical history on file.  Family Psychiatric History: Please see initial evaluation for full details. I have reviewed the history. No updates at this time.     Family History:  Family History  Problem Relation Age of Onset   Anxiety disorder Mother    Depression Mother    Anxiety disorder Maternal Aunt  Anxiety disorder Maternal Grandmother     Social History:  Social History   Socioeconomic History   Marital status: Single    Spouse name: Not on file   Number of children: Not on file   Years of education: Not on file   Highest education level: Not on file  Occupational History   Not on file  Tobacco Use   Smoking status: Never   Smokeless tobacco: Never  Substance and Sexual Activity   Alcohol use: Yes    Comment: occ.   Drug use: No   Sexual activity: Not on file  Other Topics Concern   Not on file  Social History Narrative   Not on file   Social Determinants of Health    Financial Resource Strain: Not on file  Food Insecurity: Not on file  Transportation Needs: Not on file  Physical Activity: Not on file  Stress: Not on file  Social Connections: Not on file    Allergies: No Known Allergies  Metabolic Disorder Labs: No results found for: HGBA1C, MPG No results found for: PROLACTIN No results found for: CHOL, TRIG, HDL, CHOLHDL, VLDL, LDLCALC Lab Results  Component Value Date   TSH 0.69 08/28/2018    Therapeutic Level Labs: No results found for: LITHIUM No results found for: VALPROATE No components found for:  CBMZ  Current Medications: Current Outpatient Medications  Medication Sig Dispense Refill   amLODipine (NORVASC) 5 MG tablet TAKE 1 TABLET BY MOUTH DAILY WITH OLMERSARTAN     [START ON 11/01/2021] ARIPiprazole (ABILIFY) 20 MG tablet Take 1 tablet (20 mg total) by mouth daily. 90 tablet 0   buPROPion (WELLBUTRIN XL) 150 MG 24 hr tablet Take 3 tablets (450 mg total) by mouth daily. 90 tablet 1   escitalopram (LEXAPRO) 20 MG tablet Take 1 tablet (20 mg total) by mouth daily. 90 tablet 0   hydrochlorothiazide (MICROZIDE) 12.5 MG capsule Take 12.5 mg by mouth daily.     ibuprofen (ADVIL,MOTRIN) 200 MG tablet Take 200 mg by mouth every 6 (six) hours as needed for mild pain or moderate pain.     olmesartan (BENICAR) 20 MG tablet TAKE 1 TABLET BY MOUTH DAILY FOR BLOOD PRESSURE WITH AMLODIPINE     [START ON 10/12/2021] traZODone (DESYREL) 50 MG tablet Take 1 tablet (50 mg total) by mouth at bedtime as needed for sleep. 90 tablet 0   No current facility-administered medications for this visit.     Musculoskeletal: Strength & Muscle Tone: within normal limits Gait & Station: normal Patient leans: N/A  Psychiatric Specialty Exam: Review of Systems  Psychiatric/Behavioral:  Positive for decreased concentration, dysphoric mood, sleep disturbance and suicidal ideas. Negative for agitation, behavioral problems, confusion, hallucinations and  self-injury. The patient is nervous/anxious. The patient is not hyperactive.   All other systems reviewed and are negative.  There were no vitals taken for this visit.There is no height or weight on file to calculate BMI.  General Appearance: Fairly Groomed  Eye Contact:  Good  Speech:  Clear and Coherent  Volume:  Normal  Mood:  Depressed  Affect:  Appropriate, Congruent, and tense at times, calm  Thought Process:  Coherent  Orientation:  Full (Time, Place, and Person)  Thought Content: Logical   Suicidal Thoughts:  Yes.  without intent/plan  Homicidal Thoughts:  No  Memory:  Immediate;   Good  Judgement:  Good  Insight:  Good  Psychomotor Activity:  Normal  Concentration:  Concentration: Good and Attention Span: Good  Recall:  Good  Fund of Knowledge: Good  Language: Good  Akathisia:  No  Handed:  Right  AIMS (if indicated): not done  Assets:  Communication Skills Desire for Improvement  ADL's:  Intact  Cognition: WNL  Sleep:   hypersomnia   Screenings: GAD-7    Flowsheet Row Counselor [facility]23/[facility]  Total GAD-7 Score 16      PHQ2-9    Flowsheet Row Counselor from 08/23/2021 in [facility] Video Visit from confidential encounter on 07/14/2021 Video Visit from confidential encounter on 03/10/2021 Video Visit from confidential encounter on 11/03/2020 Video Visit from confidential encounter on 09/17/2020  PHQ-2 Total Score 3 4 2 2 2   PHQ-9 Total Score 10 19 11 11 12       Flowsheet Row Counselor from 08/23/2021 in [facility] Video Visit from confidential encounter on 07/14/2021 Video Visit from confidential encounter on 03/10/2021  C-SSRS RISK CATEGORY Error: Q3, 4, or 5 should not be populated when Q2 is No Error: Q3, 4, or 5 should not be populated when Q2 is No Error: Q3, 4, or 5 should not be populated when Q2 is No         Assessment and Plan:  Troy Mahoney is a 31 y.o. year old male with a history of depression, hypertension, who presents for follow up appointment for below.     2. Moderate episode of recurrent major depressive disorder (HCC) 3. Borderline personality disorder (HCC) He continues to report depressive symptoms and anxiety, although there has been more improvement since the last visit.  Psychosocial stressors includes being a caregiver of his mother with medical condition, and upcoming anniversary of loss of his father.  Other psychosocial stressors includes unemployment, many losses through his life, and conflict with his sister.  We uptitrate bupropion to optimize treatment for depression.  He has no known history of seizure.  Will continue Lexapro, Abilify to target depression.   4. Insomnia, unspecified type 1. Fatigue, unspecified type He reports worsening in the hypersomnia and fatigue.  He has snoring as well.  We make referral for evaluation of sleep apnea.  He will take trazodone only as needed for insomnia.   # Inattention Although he reports inattention, etiology is likely multifactorial given his active mood symptoms as described above.  He agrees to prioritize treatment of his mood symptoms and reevaluate his inattention.   This clinician has discussed the side effect associated with medication prescribed during this encounter. Please refer to notes in the previous encounters for more details.     Plan Continue lexapro 20 mg daily  Increase bupropion 450 mg daily Continue Abilify 20 mg daily Continue trazodone 25 -50 mg at night as needed  Referral for sleep evaluation Next appointment: 5/9 at 2:30 for 30 mins, in person  [email or web address] (he will contact the clinic if unable to make it in perso   Past trials of medication: sertraline (insomnia) , bupropion, lamotrigine (weight gain)              I have reviewed suicide assessment in detail. No change in the  following assessment.    The patient demonstrates the following risk factors for suicide: Chronic risk factors for suicide include: psychiatric disorder of depression. Acute risk factors for suicide include: loss (financial, interpersonal, professional). Protective factors for this patient include: responsibility to others (children, family), coping skills and hope for the future. Considering these factors, the overall suicide risk at  this point appears to be moderate, but not at imminent risk. He denies gun access at home. Patient is appropriate for outpatient follow up.     Collaboration of Care: Collaboration of Care: Other referral as above  Consent: Patient/Guardian gives verbal consent for treatment and assignment of benefits for services provided during this visit. Patient/Guardian expressed understanding and agreed to proceed.    Troy Hotter, MD 09/13/2021, 10:44 AM

## 2021-09-08 NOTE — Telephone Encounter (Signed)
pt states that it is for a previous claim that he had open with them. it still needs to be filled out and sent in.  ?

## 2021-09-08 NOTE — Telephone Encounter (Signed)
Noted, thanks!

## 2021-09-13 ENCOUNTER — Ambulatory Visit (INDEPENDENT_AMBULATORY_CARE_PROVIDER_SITE_OTHER): Payer: Self-pay | Admitting: Clinical

## 2021-09-13 ENCOUNTER — Encounter: Payer: Self-pay | Admitting: Psychiatry

## 2021-09-13 ENCOUNTER — Other Ambulatory Visit: Payer: Self-pay

## 2021-09-13 ENCOUNTER — Telehealth (INDEPENDENT_AMBULATORY_CARE_PROVIDER_SITE_OTHER): Payer: Self-pay | Admitting: Psychiatry

## 2021-09-13 DIAGNOSIS — R5383 Other fatigue: Secondary | ICD-10-CM

## 2021-09-13 DIAGNOSIS — F603 Borderline personality disorder: Secondary | ICD-10-CM

## 2021-09-13 DIAGNOSIS — G4700 Insomnia, unspecified: Secondary | ICD-10-CM

## 2021-09-13 DIAGNOSIS — F331 Major depressive disorder, recurrent, moderate: Secondary | ICD-10-CM

## 2021-09-13 DIAGNOSIS — F419 Anxiety disorder, unspecified: Secondary | ICD-10-CM

## 2021-09-13 MED ORDER — ARIPIPRAZOLE 20 MG PO TABS: 20.0000 mg | ORAL_TABLET | Freq: Every day | ORAL | 0 refills | Status: DC

## 2021-09-13 MED ORDER — BUPROPION HCL ER (XL) 150 MG PO TB24: 450.0000 mg | ORAL_TABLET | Freq: Every day | ORAL | 1 refills | Status: DC

## 2021-09-13 MED ORDER — TRAZODONE HCL 50 MG PO TABS: 50.0000 mg | ORAL_TABLET | Freq: Every evening | ORAL | 0 refills | Status: DC | PRN

## 2021-09-13 NOTE — Patient Instructions (Addendum)
Continue lexapro 20 mg daily  ?Increase bupropion 450 mg daily ?Continue Abilify 20 mg daily ?Continue trazodone 25 -50 mg at night as needed  ?Referral for sleep evaluation ?Next appointment: 5/9 at 2:30, in person ? ?The next visit will be in person visit. Please arrive 15 mins before the scheduled time.  ? ?[facility]  ?Address: [address], [place], [place] [address]   ?

## 2021-09-13 NOTE — Progress Notes (Signed)
Virtual Visit via Telephone Note ?  ?I connected with Troy Mahoney on 09/13/21 at  9:00 AM EDT by telephone and verified that I am speaking with the correct person using two identifiers. ?  ?Location: ?Patient: Home ?Provider: Office ?  ?I discussed the limitations, risks, security and privacy concerns of performing an evaluation and management service by telephone and the availability of in person appointments. I also discussed with the patient that there may be a patient responsible charge related to this service. The patient expressed understanding and agreed to proceed. ?  ?THERAPIST PROGRESS NOTE ?  ?Session Time: 9:00AM-9:45AM ?  ?Participation Level: Active ?  ?Behavioral Response: CasualAlertDepressed ?  ?Type of Therapy: Individual Therapy ?  ?Treatment Goals addressed: Coping ?  ?Interventions: CBT, Motivational Interviewing, Solution Focused and Supportive ?  ?Summary: Troy Mahoney is a 31 y.o. male who presents with Depression and Anxiety The OPT therapist worked with the patient for his OPT treatment. The OPT therapist utilized Motivational Interviewing to assist in creating therapeutic repore. The patient in the session was engaged and work in collaboration giving feedback about his triggers and symptoms over the past few weeks including losing his job due and his Mother going through a kidney transplant procedure.  The patients Mother did not do well post the transplant and was hospitalized 2x post the procedure and is still currently working on her active recovery and this has involved the patient being more involved as a caretaker.The OPT therapist utilized Cognitive Behavioral Therapy through cognitive restructuring as well as worked with the patient on coping strategies to assist in management of Depression and Anxiety. The OPT therapist worked with the patient to examine options/ steps to help him manage current stressors and use decision making steps to assist in making decisions. The  patient noted he is currently actively seeking new employement. ?  ?Suicidal/Homicidal: Nowithout intent/plan ?  ?Therapist Response: The OPT therapist worked with the patient for the patients scheduled session. The patient was engaged in his session and gave feedback in relation to triggers, symptoms, and behavior responses over the past few weeks. The OPT therapist worked with the patient utilizing an in session Cognitive Behavioral Therapy exercise.The OPT therapist worked with the patient on postive thinking The patient was responsive in the session and verbalized, " I realize I have a habbit of seeing things in black and white and I need to change that and try to use decision making to help me move forward". The OPT therapist worked with the patient on considering the fit of his next job and finding a employer and type of work that he feels interested in. The OPT therapist will continue treatment work with the patient in his next scheduled session ?  ?Plan: Return again in 2/3 weeks. ?  ?Diagnosis:      Axis I: Recurrent moderate major depressive disorder with anxiety ?  ?                        Axis II: No diagnosis ?  ? ? ?Collaboration of Care: No additional collaboration of care for this session. ? ?Patient/Guardian was advised Release of Information must be obtained prior to any record release in order to collaborate their care with an outside provider. Patient/Guardian was advised if they have not already done so to contact the registration department to sign all necessary forms in order for [place] to release information regarding their care.  ? ?Consent: Patient/Guardian gives  verbal consent for treatment and assignment of benefits for services provided during this visit. Patient/Guardian expressed understanding and agreed to proceed.  ? ? ? ? ?  ?I discussed the assessment and treatment plan with the patient. The patient was provided an opportunity to ask questions and all were answered. The patient agreed  with the plan and demonstrated an understanding of the instructions. ?  ?The patient was advised to call back or seek an in-person evaluation if the symptoms worsen or if the condition fails to improve as anticipated. ?  ?I provided 45 minutes of non-face-to-face time during this encounter ?  ?Jerrine Urschel T.Eulas Post, LCSW ?  ?09/13/2021 ?

## 2021-10-04 ENCOUNTER — Ambulatory Visit (INDEPENDENT_AMBULATORY_CARE_PROVIDER_SITE_OTHER): Payer: 59 | Admitting: Clinical

## 2021-10-04 DIAGNOSIS — R69 Illness, unspecified: Secondary | ICD-10-CM | POA: Diagnosis not present

## 2021-10-04 DIAGNOSIS — F331 Major depressive disorder, recurrent, moderate: Secondary | ICD-10-CM

## 2021-10-04 DIAGNOSIS — F419 Anxiety disorder, unspecified: Secondary | ICD-10-CM

## 2021-10-04 NOTE — Progress Notes (Signed)
Virtual Visit via Telephone Note ?  ?I connected with Troy Mahoney on 10/04/21 at  9:00 AM EDT by telephone and verified that I am speaking with the correct person using two identifiers. ?  ?Location: ?Patient: Home ?Provider: Office ?  ?I discussed the limitations, risks, security and privacy concerns of performing an evaluation and management service by telephone and the availability of in person appointments. I also discussed with the patient that there may be a patient responsible charge related to this service. The patient expressed understanding and agreed to proceed. ?  ?THERAPIST PROGRESS NOTE ?  ?Session Time: 9:00AM-9:45AM ?  ?Participation Level: Active ?  ?Behavioral Response: CasualAlertDepressed ?  ?Type of Therapy: Individual Therapy ?  ?Treatment Goals addressed: Coping ?  ?Interventions: CBT, Motivational Interviewing, Solution Focused and Supportive ?  ?Summary: Troy Mahoney is a 31 y.o. male who presents with Depression and Anxiety The OPT therapist worked with the patient for his OPT treatment. The OPT therapist utilized Motivational Interviewing to assist in creating therapeutic repore. The patient in the session was engaged and spoke about updating his resume and actively searching for employment. The patient spoke about working on managing his reactive responses , " I have not cussed out anyone recently". The OPT therapist utilized Cognitive Behavioral Therapy through cognitive restructuring as well as worked with the patient on coping strategies to assist in management of Depression and Anxiety. The OPT therapist worked with the patient to manage current stressors and use decision making steps to assist in making decisions. The OPT therapist worked with the patient on implementing a self care blueprint focusing of his basic needs areas of sleep,hygiene, exercise, and eating habits. ?  ?Suicidal/Homicidal: Nowithout intent/plan ?  ?Therapist Response: The OPT therapist worked with the  patient for the patients scheduled session. The patient was engaged in his session and gave feedback in relation to triggers, symptoms, and behavior responses over the past few weeks. The OPT therapist worked with the patient utilizing an in session Cognitive Behavioral Therapy exercise.The OPT therapist worked with the patient on postive thinking The patient was responsive in the session and verbalized, " I realize I have to try to get back to the basics and focus on my self care and then filter my interactions with others". The patient  spoke about his challenge currently of finding a good fit for his next job. The OPT therapist will continue treatment work with the patient in his next scheduled session ?  ?Plan: Return again in 2/3 weeks. ?  ?Diagnosis:      Axis I: Recurrent moderate major depressive disorder with anxiety ?  ?                        Axis II: No diagnosis ?  ?  ?  ?Collaboration of Care: No additional collaboration of care for this session. ?  ?Patient/Guardian was advised Release of Information must be obtained prior to any record release in order to collaborate their care with an outside provider. Patient/Guardian was advised if they have not already done so to contact the registration department to sign all necessary forms in order for [place] to release information regarding their care.  ?  ?Consent: Patient/Guardian gives verbal consent for treatment and assignment of benefits for services provided during this visit. Patient/Guardian expressed understanding and agreed to proceed.  ?  ?  ?I discussed the assessment and treatment plan with the patient. The patient was provided an  opportunity to ask questions and all were answered. The patient agreed with the plan and demonstrated an understanding of the instructions. ?  ?The patient was advised to call back or seek an in-person evaluation if the symptoms worsen or if the condition fails to improve as anticipated. ?  ?I provided 45 minutes of  non-face-to-face time during this encounter ?  ?Sneha Willig T.Eulas Post, LCSW ?  ?10/04/2021 ?

## 2021-11-06 NOTE — Progress Notes (Signed)
Virtual Visit via Video Note ? ?I connected with Troy Mahoney on 11/09/21 at  2:30 PM EDT by a video enabled telemedicine application and verified that I am speaking with the correct person using two identifiers. ? ?Location: ?Patient: home ?Provider: office ?Persons participated in the visit- patient, provider  ?  ?I discussed the limitations of evaluation and management by telemedicine and the availability of in person appointments. The patient expressed understanding and agreed to proceed. ?  ?I discussed the assessment and treatment plan with the patient. The patient was provided an opportunity to ask questions and all were answered. The patient agreed with the plan and demonstrated an understanding of the instructions. ?  ?The patient was advised to call back or seek an in-person evaluation if the symptoms worsen or if the condition fails to improve as anticipated. ? ?I provided 17 minutes of non-face-to-face time during this encounter. ? ? ?Neysa Hottereina Alano Blasco, MD ? ? ? ?BH MD/PA/NP OP Progress Note ? ?11/09/2021 3:08 PM ?Troy Mahoney  ?MRN:  [id number] ? ?Chief Complaint:  ?Chief Complaint  ?Patient presents with  ? Follow-up  ? Depression  ? ?HPI:  ?This is a follow-up appointment for depression.  ?He apologized for switching from in person to a video visit due to lack of transportation, although he wanted to come.  His mother has been better and busier. He states that he wonders if he can be off bupropion due to financial strain.  However, he was later amenable to find a pharmacy where he can get good RX.  He agrees that bupropion has been working very well for him.  He is now trying to find the right career.  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  He is trying to find another job in the mean time. He is learning to work through things.  He also notices that his concentration has been much better.  He has fair sleep.   Although he wants to have a sleep evaluation, he is unable to afford it at this time.  He has fluctuation in appetite.  Although he has fleeting passive SI, he adamantly denies any plan or intent.  He denies alcohol use.  He denies any drug use except one time of THC use before.  He feels comfortable to stay on the current medication regimen at this time.  ? ? ? ?Visit Diagnosis:  ?  ICD-10-CM   ?1. MDD (major depressive disorder), recurrent episode, mild (HCC)  F33.0   ?  ?2. Borderline personality disorder (HCC)  F60.3   ?  ?3. Insomnia, unspecified type  G47.00   ?  ? ? ?Past Psychiatric History: Please see initial evaluation for full details. I have reviewed the history. No updates at this time.  ?  ? ?Past Medical History: No past medical history on file. No past surgical history on file. ? ?Family Psychiatric History: Please see initial evaluation for full details. I have reviewed the history. No updates at this time.  ?  ? ?Family History:  ?Family History  ?Problem Relation Age of Onset  ? Anxiety disorder Mother   ? Depression Mother   ? Anxiety disorder Maternal Aunt   ? Anxiety disorder Maternal Grandmother   ? ? ?Social History:  ?Social History  ? ?Socioeconomic History  ? Marital status: Single  ?  Spouse name: Not on file  ? Number of children: Not on  file  ? Years of education: Not on file  ? Highest education level: Not on file  ?Occupational History  ? Not on file  ?Tobacco Use  ? Smoking status: Never  ? Smokeless tobacco: Never  ?Substance and Sexual Activity  ? Alcohol use: Yes  ?  Comment: occ.  ? Drug use: No  ? Sexual activity: Not on file  ?Other Topics Concern  ? Not on file  ?Social History Narrative  ? Not on file  ? ?Social Determinants of Health  ? ?Financial Resource Strain: Not on file  ?Food Insecurity: Not on file  ?Transportation Needs: Not on file  ?Physical Activity: Not on file  ?Stress: Not on file  ?Social Connections: Not on file  ? ? ?Allergies: No Known  Allergies ? ?Metabolic Disorder Labs: ?No results found for: HGBA1C, MPG ?No results found for: PROLACTIN ?No results found for: CHOL, TRIG, HDL, CHOLHDL, VLDL, LDLCALC ?Lab Results  ?Component Value Date  ? TSH 0.69 08/28/2018  ? ? ?Therapeutic Level Labs: ?No results found for: LITHIUM ?No results found for: VALPROATE ?No components found for:  CBMZ ? ?Current Medications: ?Current Outpatient Medications  ?Medication Sig Dispense Refill  ? amLODipine (NORVASC) 5 MG tablet TAKE 1 TABLET BY MOUTH DAILY WITH OLMERSARTAN    ? ARIPiprazole (ABILIFY) 20 MG tablet Take 1 tablet (20 mg total) by mouth daily. 90 tablet 0  ? buPROPion (WELLBUTRIN XL) 150 MG 24 hr tablet Take 3 tablets (450 mg total) by mouth daily. 90 tablet 1  ? escitalopram (LEXAPRO) 20 MG tablet Take 1 tablet (20 mg total) by mouth daily. 90 tablet 0  ? hydrochlorothiazide (MICROZIDE) 12.5 MG capsule Take 12.5 mg by mouth daily.    ? ibuprofen (ADVIL,MOTRIN) 200 MG tablet Take 200 mg by mouth every 6 (six) hours as needed for mild pain or moderate pain.    ? olmesartan (BENICAR) 20 MG tablet TAKE 1 TABLET BY MOUTH DAILY FOR BLOOD PRESSURE WITH AMLODIPINE    ? traZODone (DESYREL) 50 MG tablet Take 1 tablet (50 mg total) by mouth at bedtime as needed for sleep. 90 tablet 0  ? ?No current facility-administered medications for this visit.  ? ? ? ?Musculoskeletal: ?Strength & Muscle Tone:  N/A ?Gait & Station:  N/A ?Patient leans: N/A ? ?Psychiatric Specialty Exam: ?Review of Systems  ?Psychiatric/Behavioral:  Positive for dysphoric mood and sleep disturbance. Negative for agitation, behavioral problems, confusion, decreased concentration, hallucinations, self-injury and suicidal ideas. The patient is nervous/anxious. The patient is not hyperactive.   ?All other systems reviewed and are negative.  ?There were no vitals taken for this visit.There is no height or weight on file to calculate BMI.  ?General Appearance: Fairly Groomed  ?Eye Contact:  Good   ?Speech:  Clear and Coherent  ?Volume:  Normal  ?Mood:   good  ?Affect:  Appropriate, Congruent, and euthymic  ?Thought Process:  Coherent  ?Orientation:  Full (Time, Place, and Person)  ?Thought Content: Logical   ?Suicidal Thoughts:  Yes.  without intent/plan  ?Homicidal Thoughts:  No  ?Memory:  Immediate;   Good  ?Judgement:  Good  ?Insight:  Good  ?Psychomotor Activity:  Normal  ?Concentration:  Concentration: Good and Attention Span: Good  ?Recall:  Good  ?Fund of Knowledge: Good  ?Language: Good  ?Akathisia:  No  ?Handed:  Right  ?AIMS (if indicated): not done  ?Assets:  Communication Skills ?Desire for Improvement  ?ADL's:  Intact  ?Cognition: WNL  ?Sleep:  Fair  ? ?  Screenings: ?GAD-7   ? ?Flowsheet Row Counselor from 08/23/2021 in [facility]  ?Total GAD-7 Score 16  ? ?  ? ?PHQ2-9   ? ?Flowsheet Row Counselor from 08/23/2021 in [facility] Video Visit from confidential encounter on 07/14/2021 Video Visit from confidential encounter on 03/10/2021 Video Visit from confidential encounter on 11/03/2020 Video Visit from confidential encounter on 09/17/2020  ?PHQ-2 Total Score 3 4 2 2 2   ?PHQ-9 Total Score 10 19 11 11 12   ? ?  ? ?Flowsheet Row Counselor from 08/23/2021 in [facility] Video Visit from confidential encounter on 07/14/2021 Video Visit from confidential encounter on 03/10/2021  ?C-SSRS RISK CATEGORY Error: Q3, 4, or 5 should not be populated when Q2 is No Error: Q3, 4, or 5 should not be populated when Q2 is No Error: Q3, 4, or 5 should not be populated when Q2 is No  ? ?  ? ? ? ?Assessment and Plan:  ?Troy Mahoney is a 31 y.o. year old male with a history of  depression, hypertension, who presents for follow up appointment for below.  ?  ?1. MDD (major depressive disorder), recurrent episode, mild (HCC) ?2. Borderline personality disorder (HCC) ?There has been significant  improvement in depressive symptoms and concentration since uptitration of bupropion.   Psychosocial stressors includes being a caregiver of his mother with medical condition, grief of loss of his father.  Other psychosocial stressors i

## 2021-11-08 ENCOUNTER — Ambulatory Visit (INDEPENDENT_AMBULATORY_CARE_PROVIDER_SITE_OTHER): Payer: 59 | Admitting: Clinical

## 2021-11-08 DIAGNOSIS — F419 Anxiety disorder, unspecified: Secondary | ICD-10-CM

## 2021-11-08 DIAGNOSIS — R69 Illness, unspecified: Secondary | ICD-10-CM | POA: Diagnosis not present

## 2021-11-08 DIAGNOSIS — F331 Major depressive disorder, recurrent, moderate: Secondary | ICD-10-CM

## 2021-11-08 NOTE — Progress Notes (Signed)
Virtual Visit via Video Note ? ?I connected with Troy Mahoney on 11/08/21 at  9:00 AM EDT by a video enabled telemedicine application and verified that I am speaking with the correct person using two identifiers. ? ?Location: ?Patient: Home ?Provider: Office ?  ?I discussed the limitations of evaluation and management by telemedicine and the availability of in person appointments. The patient expressed understanding and agreed to proceed. ? ?THERAPIST PROGRESS NOTE ?  ?Session Time: 9:00AM-9:45AM ?  ?Participation Level: Active ?  ?Behavioral Response: CasualAlertDepressed ?  ?Type of Therapy: Individual Therapy ?  ?Treatment Goals addressed: Coping ?  ?Interventions: CBT, Motivational Interviewing, Solution Focused and Supportive ?  ?Summary: Troy Mahoney is a 31 y.o. male who presents with Depression and Anxiety The OPT therapist worked with the patient for his OPT treatment. The OPT therapist utilized Motivational Interviewing to assist in creating therapeutic repore. 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 The patient spoke about working on managing his reactive responses , " I feel like I handled this differently than I would have in the past before a curveball like this would have triggered a deep depression". The OPT therapist utilized Cognitive Behavioral Therapy through cognitive restructuring as well as worked with the patient on coping strategies to assist in management of Depression and Anxiety. The OPT therapist worked with the patient to manage current stressors and use decision making steps to assist in making decisions. The OPT therapist worked with the patient on implementing a self care blueprint focusing of his basic needs areas of sleep,hygiene, exercise, and eating habits. The patient continued to identify  not having employment and finances as his current largest stressor. ?   ?Suicidal/Homicidal: Nowithout intent/plan ?  ?Therapist Response: The OPT therapist worked with the patient for the patients scheduled session. The patient was engaged in his session and gave feedback in relation to triggers, symptoms, and behavior responses over the past few weeks. The OPT therapist worked with the patient utilizing an in session Cognitive Behavioral Therapy exercise.The OPT therapist worked with the patient on postive thinking The patient was responsive in the session and verbalized, " I am at the bottom so things can only go up from here (this in relation to employment)". The patient  spoke about his challenge currently of finding a good fit for his next job. The OPT therapist will continue treatment work with the patient in his next scheduled session ?  ?Plan: Return again in 2/3 weeks. ?  ?Diagnosis:      Axis I: Recurrent moderate major depressive disorder with anxiety ?  ?                        Axis II: No diagnosis ?  ?  ?  ?Collaboration of Care: No additional collaboration of care for this session. ?  ?Patient/Guardian was advised Release of Information must be obtained prior to any record release in order to collaborate their care with an outside provider. Patient/Guardian was advised if they have not already done so to contact the registration department to sign all necessary forms in order for [place] to release information regarding their care.  ?  ?Consent: Patient/Guardian gives verbal consent for treatment and assignment of benefits for services provided during this visit. Patient/Guardian expressed understanding and agreed to proceed.  ?  ?  ?I discussed the assessment and  treatment plan with the patient. The patient was provided an opportunity to ask questions and all were answered. The patient agreed with the plan and demonstrated an understanding of the instructions. ?  ?The patient was advised to call back or seek an in-person evaluation if the symptoms worsen or if the condition  fails to improve as anticipated. ?  ?I provided 45 minutes of non-face-to-face time during this encounter ?  ?Sarahbeth Cashin T.Montez Morita, LCSW ?  ?11/08/2021 ?

## 2021-11-09 ENCOUNTER — Encounter: Payer: Self-pay | Admitting: Psychiatry

## 2021-11-09 ENCOUNTER — Telehealth (INDEPENDENT_AMBULATORY_CARE_PROVIDER_SITE_OTHER): Payer: 59 | Admitting: Psychiatry

## 2021-11-09 DIAGNOSIS — F330 Major depressive disorder, recurrent, mild: Secondary | ICD-10-CM | POA: Diagnosis not present

## 2021-11-09 DIAGNOSIS — F603 Borderline personality disorder: Secondary | ICD-10-CM | POA: Diagnosis not present

## 2021-11-09 DIAGNOSIS — R69 Illness, unspecified: Secondary | ICD-10-CM | POA: Diagnosis not present

## 2021-11-09 DIAGNOSIS — G4700 Insomnia, unspecified: Secondary | ICD-10-CM | POA: Diagnosis not present

## 2021-11-09 NOTE — Patient Instructions (Signed)
Continue lexapro 20 mg daily  ?Continue bupropion 450 mg daily ?Continue Abilify 20 mg daily ?Continue trazodone 25 -50 mg at night as needed  ?Referred for sleep evaluation ?Next appointment: 7/10 at 2 PM, in person ? ?The next visit will be in person visit. Please arrive 15 mins before the scheduled time.  ? ?[facility]  ?Address: [address], [place], [place] [address]   ?

## 2021-12-06 ENCOUNTER — Ambulatory Visit (INDEPENDENT_AMBULATORY_CARE_PROVIDER_SITE_OTHER): Payer: 59 | Admitting: Clinical

## 2021-12-06 DIAGNOSIS — F330 Major depressive disorder, recurrent, mild: Secondary | ICD-10-CM | POA: Diagnosis not present

## 2021-12-06 NOTE — Progress Notes (Signed)
Virtual Visit via Video Note   I connected with Troy Mahoney on 12/06/21 at  9:00 AM EDT by a video enabled telemedicine application and verified that I am speaking with the correct person using two identifiers.   Location: Patient: Home Provider: Office   I discussed the limitations of evaluation and management by telemedicine and the availability of in person appointments. The patient expressed understanding and agreed to proceed.   THERAPIST PROGRESS NOTE   Session Time: 9:00AM-9:30AM   Participation Level: Active   Behavioral Response: CasualAlertDepressed   Type of Therapy: Individual Therapy   Treatment Goals addressed: Coping   Interventions: CBT, Motivational Interviewing, Solution Focused and Supportive   Summary: Troy Mahoney is a 31 y.o. male who presents with Depression and Anxiety The OPT therapist worked with the patient for his OPT treatment. The OPT therapist utilized Motivational Interviewing to assist in creating therapeutic repore. The patient in the session was engaged and spoke about losing his pet who passed away that he has had for several years.. The patient spoke about working on managing his reactive responses , " I feel like last week was very hard but I am in a good place and focused on having a more productive week this week". The OPT therapist utilized Cognitive Behavioral Therapy through cognitive restructuring as well as worked with the patient on coping strategies to assist in management of Depression and Anxiety. The OPT therapist worked with the patient to manage current stressors and use decision making steps to assist in making decisions. The OPT therapist worked with the patient on implementing a self care blueprint focusing of his basic needs areas of sleep,hygiene, exercise, and eating habits. The patient continued to identify  not having employment and finances continue to be his current largest stressor.   Suicidal/Homicidal: Nowithout  intent/plan   Therapist Response: The OPT therapist worked with the patient for the patients scheduled session. The patient was engaged in his session and gave feedback in relation to triggers, symptoms, and behavior responses over the past few weeks. The OPT therapist worked with the patient utilizing an in session Cognitive Behavioral Therapy exercise.The OPT therapist worked with the patient on postive thinking The patient was responsive in the session and verbalized, " I think I am moving in the right direction before things would get me down and it took longer to get over them". The patient  spoke about his challenge currently of finding a good fit for a job. The OPT therapist will continue treatment work with the patient in his next scheduled session   Plan: Return again in 2/3 weeks.   Diagnosis:      Axis I: Recurrent moderate major depressive disorder with anxiety                           Axis II: No diagnosis       Collaboration of Care: No additional collaboration of care for this session.   Patient/Guardian was advised Release of Information must be obtained prior to any record release in order to collaborate their care with an outside provider. Patient/Guardian was advised if they have not already done so to contact the registration department to sign all necessary forms in order for [place] to release information regarding their care.    Consent: Patient/Guardian gives verbal consent for treatment and assignment of benefits for services provided during this visit. Patient/Guardian expressed understanding and agreed to proceed.  I discussed the assessment and treatment plan with the patient. The patient was provided an opportunity to ask questions and all were answered. The patient agreed with the plan and demonstrated an understanding of the instructions.   The patient was advised to call back or seek an in-person evaluation if the symptoms worsen or if the condition fails to  improve as anticipated.   I provided 30 minutes of non-face-to-face time during this encounter   Aurther Loft T.Montez Morita, LCSW   12/06/2021

## 2021-12-12 ENCOUNTER — Other Ambulatory Visit: Payer: Self-pay | Admitting: Psychiatry

## 2021-12-20 ENCOUNTER — Ambulatory Visit (HOSPITAL_COMMUNITY): Payer: 59 | Admitting: Clinical

## 2021-12-20 ENCOUNTER — Telehealth (HOSPITAL_COMMUNITY): Payer: Self-pay | Admitting: Clinical

## 2021-12-20 NOTE — Telephone Encounter (Signed)
The patient did not respond to contact attempts.  

## 2022-01-05 NOTE — Progress Notes (Deleted)
BH MD/PA/NP OP Progress Note  01/05/2022 8:40 AM Troy Mahoney  MRN:  [id number]  Chief Complaint: No chief complaint on file.  HPI: *** Visit Diagnosis: No diagnosis found.  Past Psychiatric History: Please see initial evaluation for full details. I have reviewed the history. No updates at this time.     Past Medical History: No past medical history on file. No past surgical history on file.  Family Psychiatric History: Please see initial evaluation for full details. I have reviewed the history. No updates at this time.     Family History:  Family History  Problem Relation Age of Onset   Anxiety disorder Mother    Depression Mother    Anxiety disorder Maternal Aunt    Anxiety disorder Maternal Grandmother     Social History:  Social History   Socioeconomic History   Marital status: Single    Spouse name: Not on file   Number of children: Not on file   Years of education: Not on file   Highest education level: Not on file  Occupational History   Not on file  Tobacco Use   Smoking status: Never   Smokeless tobacco: Never  Substance and Sexual Activity   Alcohol use: Yes    Comment: occ.   Drug use: No   Sexual activity: Not on file  Other Topics Concern   Not on file  Social History Narrative   Not on file   Social Determinants of Health   Financial Resource Strain: Not on file  Food Insecurity: Not on file  Transportation Needs: Not on file  Physical Activity: Not on file  Stress: Not on file  Social Connections: Not on file    Allergies: No Known Allergies  Metabolic Disorder Labs: No results found for: "HGBA1C", "MPG" No results found for: "PROLACTIN" No results found for: "CHOL", "TRIG", "HDL", "CHOLHDL", "VLDL", "LDLCALC" Lab Results  Component Value Date   TSH 0.69 08/28/2018    Therapeutic Level Labs: No results found for: "LITHIUM" No results found for: "VALPROATE" No results found for: "CBMZ"  Current Medications: Current  Outpatient Medications  Medication Sig Dispense Refill   amLODipine (NORVASC) 5 MG tablet TAKE 1 TABLET BY MOUTH DAILY WITH OLMERSARTAN     ARIPiprazole (ABILIFY) 20 MG tablet Take 1 tablet (20 mg total) by mouth daily. 90 tablet 0   buPROPion (WELLBUTRIN XL) 150 MG 24 hr tablet Take 3 tablets (450 mg total) by mouth daily. 90 tablet 0   escitalopram (LEXAPRO) 20 MG tablet Take 1 tablet (20 mg total) by mouth daily. 90 tablet 0   hydrochlorothiazide (MICROZIDE) 12.5 MG capsule Take 12.5 mg by mouth daily.     ibuprofen (ADVIL,MOTRIN) 200 MG tablet Take 200 mg by mouth every 6 (six) hours as needed for mild pain or moderate pain.     olmesartan (BENICAR) 20 MG tablet TAKE 1 TABLET BY MOUTH DAILY FOR BLOOD PRESSURE WITH AMLODIPINE     traZODone (DESYREL) 50 MG tablet Take 1 tablet (50 mg total) by mouth at bedtime as needed for sleep. 90 tablet 0   No current facility-administered medications for this visit.     Musculoskeletal: Strength & Muscle Tone: within normal limits Gait & Station: normal Patient leans: N/A  Psychiatric Specialty Exam: Review of Systems  There were no vitals taken for this visit.There is no height or weight on file to calculate BMI.  General Appearance: {Appearance:22683}  Eye Contact:  {BHH EYE CONTACT:22684}  Speech:  Clear and Coherent  Volume:  Normal  Mood:  {BHH MOOD:22306}  Affect:  {Affect (PAA):22687}  Thought Process:  Coherent  Orientation:  Full (Time, Place, and Person)  Thought Content: Logical   Suicidal Thoughts:  {ST/HT (PAA):22692}  Homicidal Thoughts:  {ST/HT (PAA):22692}  Memory:  Immediate;   Good  Judgement:  {Judgement (PAA):22694}  Insight:  {Insight (PAA):22695}  Psychomotor Activity:  Normal  Concentration:  Concentration: Good and Attention Span: Good  Recall:  Good  Fund of Knowledge: Good  Language: Good  Akathisia:  No  Handed:  Right  AIMS (if indicated): not done  Assets:  Communication Skills Desire for Improvement   ADL's:  Intact  Cognition: WNL  Sleep:  {BHH GOOD/FAIR/POOR:22877}   Screenings: GAD-7    Advertising copywriter from 08/23/2021 [facility]  Total GAD-7 Score 16      PHQ2-9    Flowsheet Row Counselor from 08/23/2021 in [facility] Video Visit from confidential encounter on 07/14/2021 Video Visit from confidential encounter on 03/10/2021 Video Visit from confidential encounter on 11/03/2020 Video Visit from confidential encounter on 09/17/2020  PHQ-2 Total Score 3 4 2 2 2   PHQ-9 Total Score 10 19 11 11 12       Flowsheet Row Counselor from 08/23/2021 in [facility] Video Visit from confidential encounter on 07/14/2021 Video Visit from confidential encounter on 03/10/2021  C-SSRS RISK CATEGORY Error: Q3, 4, or 5 should not be populated when Q2 is No Error: Q3, 4, or 5 should not be populated when Q2 is No Error: Q3, 4, or 5 should not be populated when Q2 is No        Assessment and Plan:  Troy Mahoney is a 31 y.o. year old male with a history of depression, hypertension, who presents for follow up appointment for below.     1. MDD (major depressive disorder), recurrent episode, mild (HCC) 2. Borderline personality disorder (HCC) There has been significant improvement in depressive symptoms and concentration since uptitration of bupropion.   Psychosocial stressors includes being a caregiver of his mother with medical condition, grief of loss of his father.  Other psychosocial stressors includes unemployment, many losses through his life, and conflict with his sister.  Will continue current medication regimen.  Will continue Lexapro, bupropion and Abilify to target depression.    3. Insomnia, unspecified type Although referral was made due to his history of fatigue, snoring and insomnia, he cannot not afford this at this time.  Will continue trazodone as  needed for insomnia.    This clinician has discussed the side effect associated with medication prescribed during this encounter. Please refer to notes in the previous encounters for more details.     Plan (he will notify Cora Collum the pharmacy for medication to be sent) Continue lexapro 20 mg daily  Continue bupropion 450 mg daily Continue Abilify 20 mg daily Continue trazodone 25 -50 mg at night as needed  Referred for sleep evaluation Next appointment: 7/10 at 2 PM, in person [email or web address] (he will contact the clinic if unable to make it in person)   Past trials of medication: sertraline (insomnia) , bupropion, lamotrigine (weight gain)              I have reviewed suicide assessment in detail. No change in the following assessment.    The patient demonstrates the following risk factors for suicide: Chronic risk factors for suicide include: psychiatric disorder of depression. Acute risk factors for suicide  include: loss (financial, interpersonal, professional). Protective factors for this patient include: responsibility to others (children, family), coping skills and hope for the future. Considering these factors, the overall suicide risk at this point appears to be moderate, but not at imminent risk. He denies gun access at home. Patient is appropriate for outpatient follow up.          Collaboration of Care: Collaboration of Care: {BH OP Collaboration of Care:21014065}  Patient/Guardian was advised Release of Information must be obtained prior to any record release in order to collaborate their care with an outside provider. Patient/Guardian was advised if they have not already done so to contact the registration department to sign all necessary forms in order for [place] to release information regarding their care.   Consent: Patient/Guardian gives verbal consent for treatment and assignment of benefits for services provided during this visit. Patient/Guardian expressed understanding and  agreed to proceed.    [name], MD 01/05/2022, 8:40 AM

## 2022-01-10 ENCOUNTER — Ambulatory Visit: Payer: 59 | Admitting: Psychiatry

## 2022-01-30 NOTE — Progress Notes (Unsigned)
Virtual Visit via Video Note  I connected with Troy Mahoney on 02/01/22 at 11:30 AM EDT by a video enabled telemedicine application and verified that I am speaking with the correct person using two identifiers.  Location: Patient: home Provider: office Persons participated in the visit- patient, provider    I discussed the limitations of evaluation and management by telemedicine and the availability of in person appointments. The patient expressed understanding and agreed to proceed.    I discussed the assessment and treatment plan with the patient. The patient was provided an opportunity to ask questions and all were answered. The patient agreed with the plan and demonstrated an understanding of the instructions.   The patient was advised to call back or seek an in-person evaluation if the symptoms worsen or if the condition fails to improve as anticipated.  I provided 17 minutes of non-face-to-face time during this encounter.   [name], MD    [facility] MD/PA/NP OP Progress Note  02/01/2022 12:02 PM Troy Mahoney  MRN:  [id number]  Chief Complaint:  Chief Complaint  Patient presents with   Follow-up   Depression   HPI:  This is a follow-up appointment for depression and insomnia.  He states that he is currently on long-term disabilit, and yhe was advised from the previous work to apply for [employer].  It is troubling to him as it feels to him that he gives up his life.  He is at the limbo what to do, and is not sure which decisions to make.  He feels stuck.  He is not sure if he is ready to go back to his previous job as it causes him stress.  He is also unsure about the new work.  He agrees to try finding the opportunity for her part-time job to see how it goes.  He feels better after talking about this.  He has been trying to keep himself busy.  He enjoys gardening.  His mother has been doing much better.  Although she has not returned to work yet, he does not need to  help her as much.  He agrees that they have been communicating better with each other.  He has middle insomnia.  He is concerned about drooling, which only occurs only at night.  He is willing to have sleep evaluation now that he has insurance.  He tends to have craving for ice cream.  He denies SI.  He does not have any concern of financial strain due to medication now that he has insurance.  He is willing to stay on the current medication regimen.      Visit Diagnosis:    ICD-10-CM   1. MDD (major depressive disorder), recurrent episode, mild (HCC)  F33.0     2. Borderline personality disorder (HCC)  F60.3     3. Insomnia, unspecified type  G47.00       Past Psychiatric History: Please see initial evaluation for full details. I have reviewed the history. No updates at this time.    Past Medical History: No past medical history on file. No past surgical history on file.  Family Psychiatric History: Please see initial evaluation for full details. I have reviewed the history. No updates at this time.     Family History:  Family History  Problem Relation Age of Onset   Anxiety disorder Mother    Depression Mother    Anxiety disorder Maternal Aunt    Anxiety disorder Maternal Grandmother  Social History:  Social History   Socioeconomic History   Marital status: Single    Spouse name: Not on file   Number of children: Not on file   Years of education: Not on file   Highest education level: Not on file  Occupational History   Not on file  Tobacco Use   Smoking status: Never   Smokeless tobacco: Never  Substance and Sexual Activity   Alcohol use: Yes    Comment: occ.   Drug use: No   Sexual activity: Not on file  Other Topics Concern   Not on file  Social History Narrative   Not on file   Social Determinants of Health   Financial Resource Strain: Not on file  Food Insecurity: Not on file  Transportation Needs: Not on file  Physical Activity: Not on file   Stress: Not on file  Social Connections: Not on file    Allergies: No Known Allergies  Metabolic Disorder Labs: No results found for: "HGBA1C", "MPG" No results found for: "PROLACTIN" No results found for: "CHOL", "TRIG", "HDL", "CHOLHDL", "VLDL", "LDLCALC" Lab Results  Component Value Date   TSH 0.69 08/28/2018    Therapeutic Level Labs: No results found for: "LITHIUM" No results found for: "VALPROATE" No results found for: "CBMZ"  Current Medications: Current Outpatient Medications  Medication Sig Dispense Refill   amLODipine (NORVASC) 5 MG tablet TAKE 1 TABLET BY MOUTH DAILY WITH OLMERSARTAN     ARIPiprazole (ABILIFY) 20 MG tablet Take 1 tablet (20 mg total) by mouth daily. 90 tablet 0   buPROPion (WELLBUTRIN XL) 150 MG 24 hr tablet Take 3 tablets (450 mg total) by mouth daily. 270 tablet 0   escitalopram (LEXAPRO) 20 MG tablet Take 1 tablet (20 mg total) by mouth daily. 90 tablet 0   hydrochlorothiazide (MICROZIDE) 12.5 MG capsule Take 12.5 mg by mouth daily.     ibuprofen (ADVIL,MOTRIN) 200 MG tablet Take 200 mg by mouth every 6 (six) hours as needed for mild pain or moderate pain.     olmesartan (BENICAR) 20 MG tablet TAKE 1 TABLET BY MOUTH DAILY FOR BLOOD PRESSURE WITH AMLODIPINE     traZODone (DESYREL) 50 MG tablet Take 1 tablet (50 mg total) by mouth at bedtime as needed for sleep. 90 tablet 0   No current facility-administered medications for this visit.     Musculoskeletal: Strength & Muscle Tone: within normal limits Gait & Station: normal Patient leans: N/A  Psychiatric Specialty Exam: Review of Systems  Psychiatric/Behavioral:  Positive for dysphoric mood and sleep disturbance. Negative for agitation, behavioral problems, confusion, decreased concentration, hallucinations, self-injury and suicidal ideas. The patient is nervous/anxious. The patient is not hyperactive.   All other systems reviewed and are negative.   There were no vitals taken for this  visit.There is no height or weight on file to calculate BMI.  General Appearance: Fairly Groomed  Eye Contact:  Good  Speech:  Clear and Coherent  Volume:  Normal  Mood:  Anxious  Affect:  Appropriate, Congruent, and tense, later relaxed  Thought Process:  Coherent  Orientation:  Full (Time, Place, and Person)  Thought Content: Logical   Suicidal Thoughts:  No  Homicidal Thoughts:  No  Memory:  Immediate;   Good  Judgement:  Good  Insight:  Good  Psychomotor Activity:  Normal  Concentration:  Concentration: Good and Attention Span: Good  Recall:  Good  Fund of Knowledge: Good  Language: Good  Akathisia:  No  Handed:  Right  AIMS (if indicated): not done  Assets:  Communication Skills Desire for Improvement  ADL's:  Intact  Cognition: WNL  Sleep:  Poor   Screenings: GAD-7    Flowsheet Row Counselor from 08/23/2021 in [facility]  Total GAD-7 Score 16      PHQ2-9    Flowsheet Row Counselor from 08/23/2021 in [facility] Video Visit from confidential encounter on 07/14/2021 Video Visit from confidential encounter on 03/10/2021 Video Visit from confidential encounter on 11/03/2020 Video Visit from confidential encounter on 09/17/2020  PHQ-2 Total Score 3 4 2 2 2   PHQ-9 Total Score 10 19 11 11 12       Flowsheet Row Counselor from 08/23/2021 in [facility] Video Visit from confidential encounter on 07/14/2021 Video Visit from confidential encounter on 03/10/2021  C-SSRS RISK CATEGORY Error: Q3, 4, or 5 should not be populated when Q2 is No Error: Q3, 4, or 5 should not be populated when Q2 is No Error: Q3, 4, or 5 should not be populated when Q2 is No        Assessment and Plan:  Troy Mahoney is a 31 y.o. year old male with a history of  depression, hypertension, who presents for follow up appointment for below.   1. MDD (major depressive disorder),  recurrent episode, mild (HCC) 2. Borderline personality disorder (HCC) There has been slight worsening in depressive symptoms and anxiety due to issues around Social Security/unemployement.  Other psychosocial stressors includes many losses through his life including his father, and conflict with his sister.  He has insurance now, and will be able to have more resources including therapy.  Will continue Lexapro, bupropion and Abilify to target depression.   3. Insomnia, unspecified type Referral was made for evaluation of sleep apnea.  He is willing to have this evaluation.  Will continue trazodone as needed for insomnia.      Plan  Continue lexapro 20 mg daily  Continue bupropion 450 mg daily Continue Abilify 20 mg daily Continue trazodone 25 -50 mg at night as needed  Referred for sleep evaluation Next appointment: 9/26 at 9:30, in person [email or web address] (he will contact the clinic if unable to make it in person)   Past trials of medication: sertraline (insomnia) , bupropion, lamotrigine (weight gain)              I have reviewed suicide assessment in detail. No change in the following assessment.    The patient demonstrates the following risk factors for suicide: Chronic risk factors for suicide include: psychiatric disorder of depression. Acute risk factors for suicide include: loss (financial, interpersonal, professional). Protective factors for this patient include: responsibility to others (children, family), coping skills and hope for the future. Considering these factors, the overall suicide risk at this point appears to be moderate, but not at imminent risk. He denies gun access at home. Patient is appropriate for outpatient follow up.  This clinician has discussed the side effect associated with medication prescribed during this encounter. Please refer to notes in the previous encounters for more details.     Collaboration of Care: Collaboration of Care: Other  N/A  Patient/Guardian was advised Release of Information must be obtained prior to any record release in order to collaborate their care with an outside provider. Patient/Guardian was advised if they have not already done so to contact the registration department to sign all necessary forms in order for [place] to release information regarding their care.  Consent: Patient/Guardian gives verbal consent for treatment and assignment of benefits for services provided during this visit. Patient/Guardian expressed understanding and agreed to proceed.    [name], MD 02/01/2022, 12:02 PM

## 2022-02-01 ENCOUNTER — Encounter: Payer: Self-pay | Admitting: Psychiatry

## 2022-02-01 ENCOUNTER — Telehealth (INDEPENDENT_AMBULATORY_CARE_PROVIDER_SITE_OTHER): Payer: 59 | Admitting: Psychiatry

## 2022-02-01 DIAGNOSIS — G4700 Insomnia, unspecified: Secondary | ICD-10-CM

## 2022-02-01 DIAGNOSIS — F603 Borderline personality disorder: Secondary | ICD-10-CM

## 2022-02-01 DIAGNOSIS — R69 Illness, unspecified: Secondary | ICD-10-CM | POA: Diagnosis not present

## 2022-02-01 DIAGNOSIS — F330 Major depressive disorder, recurrent, mild: Secondary | ICD-10-CM

## 2022-02-01 MED ORDER — TRAZODONE HCL 50 MG PO TABS: 50.0000 mg | ORAL_TABLET | Freq: Every evening | ORAL | 0 refills | Status: DC | PRN

## 2022-02-01 MED ORDER — BUPROPION HCL ER (XL) 150 MG PO TB24
450.0000 mg | ORAL_TABLET | Freq: Every day | ORAL | 0 refills | Status: DC
Start: 2022-02-01 — End: 2022-03-29

## 2022-02-01 MED ORDER — ARIPIPRAZOLE 20 MG PO TABS: 20.0000 mg | ORAL_TABLET | Freq: Every day | ORAL | 0 refills | Status: DC

## 2022-02-01 MED ORDER — ESCITALOPRAM OXALATE 20 MG PO TABS: 20.0000 mg | ORAL_TABLET | Freq: Every day | ORAL | 0 refills | Status: DC

## 2022-02-01 NOTE — Patient Instructions (Signed)
Continue lexapro 20 mg daily  Continue bupropion 450 mg daily Continue Abilify 20 mg daily Continue trazodone 25 -50 mg at night as needed  Referred for sleep evaluation Next appointment: 9/26 at 9:30

## 2022-02-02 DIAGNOSIS — R0602 Shortness of breath: Secondary | ICD-10-CM | POA: Diagnosis not present

## 2022-02-02 DIAGNOSIS — R059 Cough, unspecified: Secondary | ICD-10-CM | POA: Diagnosis not present

## 2022-02-02 DIAGNOSIS — R0789 Other chest pain: Secondary | ICD-10-CM | POA: Diagnosis not present

## 2022-02-02 DIAGNOSIS — R079 Chest pain, unspecified: Secondary | ICD-10-CM | POA: Diagnosis not present

## 2022-02-02 DIAGNOSIS — Z77098 Contact with and (suspected) exposure to other hazardous, chiefly nonmedicinal, chemicals: Secondary | ICD-10-CM | POA: Diagnosis not present

## 2022-03-26 NOTE — Progress Notes (Unsigned)
BH MD/PA/NP OP Progress Note  03/29/2022 10:16 AM [place]  MRN:  [id number]  Chief Complaint:  Chief Complaint  Patient presents with   Follow-up   HPI:  This is a follow-up appointment for depression and insomnia.  He states that he has been feeling depressed for the past few weeks without any reason.  Although he knows that he has reason, he denies any triggers.  He has not gone outside for the past 2 weeks, which she partly attributes to lack of transportation.  He tries not to fight for it as it makes him feel down.  Although he is hoping to get a job, he has not found the one, which fits for him.  He wants to find a job which helps financially, and which is good for mental health.  He agrees to consider the job which is easier for him to see how it goes, although it may not be ideal at first.  He agrees to try to take a walk regularly.  He sleeps 2 PM to 11 PM.  He feels tired. The patient has mood symptoms as in PHQ-9/GAD-7.  Although he reports occasional passive SI, he denies any plan or intent.  He denies alcohol use or drug use.  He agrees to continue the current medication regimen at this time.  He is willing to contact the office to have follow-up appointment with Mr. Eulas Post.   Wt Readings from Last 3 Encounters:  03/29/22 271 lb (122.9 kg)  09/25/18 248 lb (112.5 kg)  08/28/18 251 lb (113.9 kg)    Visit Diagnosis:    ICD-10-CM   1. MDD (major depressive disorder), recurrent episode, moderate (HCC)  F33.1     2. Borderline personality disorder ([place])  F60.3     3. Insomnia, unspecified type  G47.00       Past Psychiatric History: Please see initial evaluation for full details. I have reviewed the history. No updates at this time.     Past Medical History: No past medical history on file. No past surgical history on file.  Family Psychiatric History: Please see initial evaluation for full details. I have reviewed the history. No updates at this time.     Family  History:  Family History  Problem Relation Age of Onset   Anxiety disorder Mother    Depression Mother    Anxiety disorder Maternal Aunt    Anxiety disorder Maternal Grandmother     Social History:  Social History   Socioeconomic History   Marital status: Single    Spouse name: Not on file   Number of children: Not on file   Years of education: Not on file   Highest education level: Not on file  Occupational History   Not on file  Tobacco Use   Smoking status: Never   Smokeless tobacco: Never  Substance and Sexual Activity   Alcohol use: Yes    Comment: occ.   Drug use: No   Sexual activity: Not on file  Other Topics Concern   Not on file  Social History Narrative   Not on file   Social Determinants of Health   Financial Resource Strain: Not on file  Food Insecurity: Not on file  Transportation Needs: Not on file  Physical Activity: Not on file  Stress: Not on file  Social Connections: Not on file    Allergies: No Known Allergies  Metabolic Disorder Labs: No results found for: "HGBA1C", "MPG" No results found for: "PROLACTIN" No  results found for: "CHOL", "TRIG", "HDL", "CHOLHDL", "VLDL", "LDLCALC" Lab Results  Component Value Date   TSH 0.69 08/28/2018    Therapeutic Level Labs: No results found for: "LITHIUM" No results found for: "VALPROATE" No results found for: "CBMZ"  Current Medications: Current Outpatient Medications  Medication Sig Dispense Refill   amLODipine (NORVASC) 5 MG tablet TAKE 1 TABLET BY MOUTH DAILY WITH OLMERSARTAN     [START ON 05/02/2022] ARIPiprazole (ABILIFY) 20 MG tablet Take 1 tablet (20 mg total) by mouth daily. 90 tablet 0   [START ON 05/02/2022] buPROPion (WELLBUTRIN XL) 150 MG 24 hr tablet Take 3 tablets (450 mg total) by mouth daily. 270 tablet 1   [START ON 05/02/2022] escitalopram (LEXAPRO) 20 MG tablet Take 1 tablet (20 mg total) by mouth daily. 90 tablet 1   hydrochlorothiazide (MICROZIDE) 12.5 MG capsule Take 12.5  mg by mouth daily.     ibuprofen (ADVIL,MOTRIN) 200 MG tablet Take 200 mg by mouth every 6 (six) hours as needed for mild pain or moderate pain.     olmesartan (BENICAR) 20 MG tablet TAKE 1 TABLET BY MOUTH DAILY FOR BLOOD PRESSURE WITH AMLODIPINE     [START ON 05/02/2022] traZODone (DESYREL) 50 MG tablet Take 1 tablet (50 mg total) by mouth at bedtime as needed for sleep. 90 tablet 0   No current facility-administered medications for this visit.     Musculoskeletal: Strength & Muscle Tone: within normal limits Gait & Station: normal Patient leans: N/A  Psychiatric Specialty Exam: Review of Systems  Psychiatric/Behavioral:  Positive for dysphoric mood, sleep disturbance and suicidal ideas. Negative for agitation, behavioral problems, confusion, decreased concentration, hallucinations and self-injury. The patient is nervous/anxious. The patient is not hyperactive.   All other systems reviewed and are negative.   Blood pressure (!) 166/100, pulse 86, temperature (!) 97.5 F (36.4 C), temperature source Temporal, weight 271 lb (122.9 kg).Body mass index is 33.87 kg/m.  General Appearance: Fairly Groomed  Eye Contact:  Good  Speech:  Clear and Coherent  Volume:  Normal  Mood:  Depressed  Affect:  Appropriate, Congruent, and slightly down  Thought Process:  Coherent  Orientation:  Full (Time, Place, and Person)  Thought Content: Logical   Suicidal Thoughts:  Yes.  without intent/plan  Homicidal Thoughts:  No  Memory:  Immediate;   Good  Judgement:  Good  Insight:  Good  Psychomotor Activity:  Normal  Concentration:  Concentration: Good and Attention Span: Good  Recall:  Good  Fund of Knowledge: Good  Language: Good  Akathisia:  No  Handed:  Right  AIMS (if indicated): not done  Assets:  Communication Skills Desire for Improvement  ADL's:  Intact  Cognition: WNL  Sleep:  Poor   Screenings: GAD-7    Flowsheet Row Office Visit from confidential encounter on 03/29/2022  Counselor from 08/23/2021 in [facility]  Total GAD-7 Score 12 16      PHQ2-9    Flowsheet Row Office Visit from confidential encounter on 03/29/2022 Counselor from 08/23/2021 in [facility] Video Visit from confidential encounter on 07/14/2021 Video Visit from confidential encounter on 03/10/2021 Video Visit from confidential encounter on 11/03/2020  PHQ-2 Total Score 4 3 4 2 2   PHQ-9 Total Score 11 10 19 11 11       Flowsheet Row [facility] Video Visit from confidential encounter on 07/14/2021 Video Visit from confidential encounter on 03/10/2021  C-SSRS RISK CATEGORY Error:  Q3, 4, or 5 should not be populated when Q2 is No Error: Q3, 4, or 5 should not be populated when Q2 is No Error: Q3, 4, or 5 should not be populated when Q2 is No        Assessment and Plan:  Troy Mahoney is a 31 y.o. year old male with a history of depression, hypertension, who presents for follow up appointment for below.   1. MDD (major depressive disorder), recurrent episode, moderate (HCC) 2. Borderline personality disorder (HCC) R/o seasonal affective disorder He reports slight worsening in depressive symptoms without significant triggers.  Psychosocial stressors includes financial strain, lack of transportation, many losses through his life including his father, and conflict with his sister.  He agrees to stay on the current medication regimen while working on behavioral activation.  Will continue Lexapro, bupropion and Abilify to target depression.  He is willing to get back on therapy now that he does have insurance.   3. Insomnia, unspecified type Worsening.  Referral was made for evaluation of sleep apnea.  He agrees to contact the clinic for evaluation.  Will continue trazodone as needed for insomnia at this time.   # Hypertension He had a history of  hypertension. He was advised to measure home blood pressure, and have evaluation by PCP if it continues to be elevated.   Plan  Continue lexapro 20 mg daily  Continue bupropion 450 mg daily Continue Abilify 20 mg daily Continue trazodone 25 -50 mg at night as needed  Referred for sleep evaluation Next appointment: 11/7 at 8:30 for 30 mins, video [email or web address]    Past trials of medication: sertraline (insomnia) , bupropion, lamotrigine (weight gain)              I have reviewed suicide assessment in detail. No change in the following assessment.    The patient demonstrates the following risk factors for suicide: Chronic risk factors for suicide include: psychiatric disorder of depression. Acute risk factors for suicide include: loss (financial, interpersonal, professional). Protective factors for this patient include: responsibility to others (children, family), coping skills and hope for the future. Considering these factors, the overall suicide risk at this point appears to be moderate, but not at imminent risk. He denies gun access at home. Patient is appropriate for outpatient follow up.  I have utilized the [place] Controlled Substances Reporting System (PMP AWARxE) to confirm adherence regarding the patient's medication. My review reveals appropriate prescription fills.      Collaboration of Care: Collaboration of Care: Other N/A  Patient/Guardian was advised Release of Information must be obtained prior to any record release in order to collaborate their care with an outside provider. Patient/Guardian was advised if they have not already done so to contact the registration department to sign all necessary forms in order for [place] to release information regarding their care.   Consent: Patient/Guardian gives verbal consent for treatment and assignment of benefits for services provided during this visit. Patient/Guardian expressed understanding and agreed to proceed.    [name],  MD 03/29/2022, 10:16 AM

## 2022-03-29 ENCOUNTER — Telehealth: Payer: Self-pay | Admitting: Psychiatry

## 2022-03-29 ENCOUNTER — Ambulatory Visit (INDEPENDENT_AMBULATORY_CARE_PROVIDER_SITE_OTHER): Payer: 59 | Admitting: Psychiatry

## 2022-03-29 ENCOUNTER — Encounter: Payer: Self-pay | Admitting: Psychiatry

## 2022-03-29 VITALS — BP 166/100 | HR 86 | Temp 97.5°F | Wt 271.0 lb

## 2022-03-29 DIAGNOSIS — G4700 Insomnia, unspecified: Secondary | ICD-10-CM

## 2022-03-29 DIAGNOSIS — F603 Borderline personality disorder: Secondary | ICD-10-CM

## 2022-03-29 DIAGNOSIS — F331 Major depressive disorder, recurrent, moderate: Secondary | ICD-10-CM

## 2022-03-29 DIAGNOSIS — R69 Illness, unspecified: Secondary | ICD-10-CM | POA: Diagnosis not present

## 2022-03-29 MED ORDER — BUPROPION HCL ER (XL) 150 MG PO TB24
450.0000 mg | ORAL_TABLET | Freq: Every day | ORAL | 1 refills | Status: DC
Start: 2022-05-02 — End: 2023-03-13

## 2022-03-29 MED ORDER — ARIPIPRAZOLE 20 MG PO TABS: 20.0000 mg | ORAL_TABLET | Freq: Every day | ORAL | 0 refills | Status: DC

## 2022-03-29 MED ORDER — TRAZODONE HCL 50 MG PO TABS: 50.0000 mg | ORAL_TABLET | Freq: Every evening | ORAL | 0 refills | Status: DC | PRN

## 2022-03-29 MED ORDER — ESCITALOPRAM OXALATE 20 MG PO TABS: 20.0000 mg | ORAL_TABLET | Freq: Every day | ORAL | 1 refills | Status: DC

## 2022-03-29 NOTE — Telephone Encounter (Signed)
left message to call office back to let us know if we needed to complete the paperwork or not.

## 2022-03-29 NOTE — Telephone Encounter (Signed)
I see the form from [place]. I do not think he works anymore. Could you ask him if this form needs to be completed? Thanks.

## 2022-04-06 ENCOUNTER — Telehealth: Payer: Self-pay

## 2022-04-06 NOTE — Telephone Encounter (Addendum)
Addendum: the form was completed.

## 2022-04-06 NOTE — Telephone Encounter (Signed)
message was left from the [employer] checking on the status of the paperwork. they need back as soon as possible.

## 2022-04-06 NOTE — Telephone Encounter (Signed)
pt called states that the [employer] reached out to him and he said he does need the paperwork completed.

## 2022-05-07 NOTE — Progress Notes (Unsigned)
Virtual Visit via Video Note  I connected with Troy Mahoney on 05/10/22 at  8:30 AM EST by a video enabled telemedicine application and verified that I am speaking with the correct person using two identifiers.  Location: Patient: home Provider: office Persons participated in the visit- patient, provider    I discussed the limitations of evaluation and management by telemedicine and the availability of in person appointments. The patient expressed understanding and agreed to proceed.    I discussed the assessment and treatment plan with the patient. The patient was provided an opportunity to ask questions and all were answered. The patient agreed with the plan and demonstrated an understanding of the instructions.   The patient was advised to call back or seek an in-person evaluation if the symptoms worsen or if the condition fails to improve as anticipated.  I provided 14 minutes of non-face-to-face time during this encounter.   [name], MD    [facility] MD/PA/NP OP Progress Note  05/10/2022 8:57 AM Troy Mahoney  MRN:  [id number]  Chief Complaint:  Chief Complaint  Patient presents with   Follow-up   Depression   HPI:  This is a follow-up appointment for depression and insomnia.  He states that he has been doing better and "steady."  He decided to try the job him when he might feel uncomfortable.  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 It is a full time, and work from home.  He is hoping to get the car once he is employed.  He has been trying to use techniques he learned through therapy and during this appointment.  His mother is doing well.  It has been an year since she had transplant.  Although he has not been able to do exercise due to the cold weather, he is trying to do some activity like pirates at home.  He sleeps better.  He feels less depressed.  He reports decrease in appetite, and has eaten healthy diet such as vegetables.  Although he has fleeting moment  of SI when things are not going well, he is able to come out of it, and it has been less intense.  He denies any plan or intent.  He feels comfortable to stay on the medication as it is.     Visit Diagnosis:    ICD-10-CM   1. MDD (major depressive disorder), recurrent, in partial remission (HCC)  F33.41     2. Borderline personality disorder (HCC)  F60.3     3. Insomnia, unspecified type  G47.00       Past Psychiatric History: Please see initial evaluation for full details. I have reviewed the history. No updates at this time.     Past Medical History: No past medical history on file. No past surgical history on file.  Family Psychiatric History: Please see initial evaluation for full details. I have reviewed the history. No updates at this time.     Family History:  Family History  Problem Relation Age of Onset   Anxiety disorder Mother    Depression Mother    Anxiety disorder Maternal Aunt    Anxiety disorder Maternal Grandmother     Social History:  Social History   Socioeconomic History   Marital status: Single    Spouse name: Not on file   Number of children: Not on file   Years of education: Not on file   Highest education level: Not on file  Occupational History   Not  on file  Tobacco Use   Smoking status: Never   Smokeless tobacco: Never  Substance and Sexual Activity   Alcohol use: Yes    Comment: occ.   Drug use: No   Sexual activity: Not on file  Other Topics Concern   Not on file  Social History Narrative   Not on file   Social Determinants of Health   Financial Resource Strain: Not on file  Food Insecurity: Not on file  Transportation Needs: Not on file  Physical Activity: Not on file  Stress: Not on file  Social Connections: Not on file    Allergies: No Known Allergies  Metabolic Disorder Labs: No results found for: "HGBA1C", "MPG" No results found for: "PROLACTIN" No results found for: "CHOL", "TRIG", "HDL", "CHOLHDL", "VLDL",  "LDLCALC" Lab Results  Component Value Date   TSH 0.69 08/28/2018    Therapeutic Level Labs: No results found for: "LITHIUM" No results found for: "VALPROATE" No results found for: "CBMZ"  Current Medications: Current Outpatient Medications  Medication Sig Dispense Refill   amLODipine (NORVASC) 5 MG tablet TAKE 1 TABLET BY MOUTH DAILY WITH OLMERSARTAN     ARIPiprazole (ABILIFY) 20 MG tablet Take 1 tablet (20 mg total) by mouth daily. 90 tablet 0   buPROPion (WELLBUTRIN XL) 150 MG 24 hr tablet Take 3 tablets (450 mg total) by mouth daily. 270 tablet 1   escitalopram (LEXAPRO) 20 MG tablet Take 1 tablet (20 mg total) by mouth daily. 90 tablet 1   hydrochlorothiazide (MICROZIDE) 12.5 MG capsule Take 12.5 mg by mouth daily.     ibuprofen (ADVIL,MOTRIN) 200 MG tablet Take 200 mg by mouth every 6 (six) hours as needed for mild pain or moderate pain.     olmesartan (BENICAR) 20 MG tablet TAKE 1 TABLET BY MOUTH DAILY FOR BLOOD PRESSURE WITH AMLODIPINE     traZODone (DESYREL) 50 MG tablet Take 1 tablet (50 mg total) by mouth at bedtime as needed for sleep. 90 tablet 0   No current facility-administered medications for this visit.     Musculoskeletal: Strength & Muscle Tone:  N/A Gait & Station:  N/A Patient leans: N/A  Psychiatric Specialty Exam: Review of Systems  Psychiatric/Behavioral:  Positive for dysphoric mood. Negative for agitation, behavioral problems, confusion, decreased concentration, hallucinations, self-injury, sleep disturbance and suicidal ideas. The patient is nervous/anxious. The patient is not hyperactive.   All other systems reviewed and are negative.   There were no vitals taken for this visit.There is no height or weight on file to calculate BMI.  General Appearance: Fairly Groomed  Eye Contact:  Good  Speech:  Clear and Coherent  Volume:  Normal  Mood:   better  Affect:  Appropriate, Congruent, and calmer  Thought Process:  Coherent  Orientation:  Full  (Time, Place, and Person)  Thought Content: Logical   Suicidal Thoughts:  Yes.  without intent/plan  Homicidal Thoughts:  No  Memory:  Immediate;   Good  Judgement:  Good  Insight:  Good  Psychomotor Activity:  Normal  Concentration:  Concentration: Good and Attention Span: Good  Recall:  Good  Fund of Knowledge: Good  Language: Good  Akathisia:  No  Handed:  Right  AIMS (if indicated): not done  Assets:  Communication Skills Desire for Improvement  ADL's:  Intact  Cognition: WNL  Sleep:  Fair   Screenings: GAD-7    Flowsheet Row Office Visit from confidential encounter on 03/29/2022 Counselor from 08/23/2021 in [facility]  Total GAD-7 Score 12 16      PHQ2-9    [place] Office Visit from confidential encounter on 03/29/2022 Counselor from 08/23/2021 in [place] Video Visit from confidential encounter on 07/14/2021 Video Visit from confidential encounter on 03/10/2021 Video Visit from confidential encounter on 11/03/2020  PHQ-2 Total Score 4 3 4 2 2   PHQ-9 Total Score 11 10 19 11 11       Flowsheet Row Counselor from 08/23/2021 in [place] [facility] Video Visit from confidential encounter on 07/14/2021 Video Visit from confidential encounter on 03/10/2021  C-SSRS RISK CATEGORY Error: Q3, 4, or 5 should not be populated when Q2 is No Error: Q3, 4, or 5 should not be populated when Q2 is No Error: Q3, 4, or 5 should not be populated when Q2 is No        Assessment and Plan:  Troy Mahoney is a 31 y.o. year old male with a history of depression, hypertension, who presents for follow up appointment for below.   1. MDD (major depressive disorder), recurrent, in partial remission ([place]) 2. Borderline personality disorder ([place]) There has been improvement and depressive symptoms since the last visit.  Psychosocial stressors includes financial strain, lack of  transportation, many losses through his life including his father, and conflict with his sister.  Will continue Lexapro, bupropion, and Abilify to target depression.  Coached behavioral activation.  He agrees to have follow up appointment for therapy to continue working on skills especially in preparation of getting back to work.   3. Insomnia, unspecified type Improving.  Will continue trazodone as needed for insomnia.  Noted that although he was recommended for sleep evaluation, it is on hold now due to lack of transportation.    # Hypertension He had a history of hypertension. He was advised to measure home blood pressure, and have evaluation by PCP if it continues to be elevated.    Plan  Continue lexapro 20 mg daily  Continue bupropion 450 mg daily Continue Abilify 20 mg daily Continue trazodone 25 -50 mg at night as needed  Referred for sleep evaluation Next appointment: 1/3 at 9 AM for 30 mins, video [email or web address]    Past trials of medication: sertraline (insomnia) , bupropion, lamotrigine (weight gain)              The patient demonstrates the following risk factors for suicide: Chronic risk factors for suicide include: psychiatric disorder of depression. Acute risk factors for suicide include: loss (financial, interpersonal, professional). Protective factors for this patient include: responsibility to others (children, family), coping skills and hope for the future. Considering these factors, the overall suicide risk at this point appears to be moderate, but not at imminent risk. He denies gun access at home. Patient is appropriate for outpatient follow up.   This clinician has discussed the side effect associated with medication prescribed during this encounter. Please refer to notes in the previous encounters for more details.     Collaboration of Care: Collaboration of Care: Other reviewed notes in Epic  Patient/Guardian was advised Release of Information must be obtained  prior to any record release in order to collaborate their care with an outside provider. Patient/Guardian was advised if they have not already done so to contact the registration department to sign all necessary forms in order for [place] to release information regarding their care.   Consent: Patient/Guardian gives verbal consent for treatment and assignment of benefits for services provided during this  visit. Patient/Guardian expressed understanding and agreed to proceed.    [name], MD 05/10/2022, 8:57 AM

## 2022-05-10 ENCOUNTER — Encounter: Payer: Self-pay | Admitting: Psychiatry

## 2022-05-10 ENCOUNTER — Telehealth (INDEPENDENT_AMBULATORY_CARE_PROVIDER_SITE_OTHER): Payer: 59 | Admitting: Psychiatry

## 2022-05-10 DIAGNOSIS — F3341 Major depressive disorder, recurrent, in partial remission: Secondary | ICD-10-CM | POA: Diagnosis not present

## 2022-05-10 DIAGNOSIS — G4700 Insomnia, unspecified: Secondary | ICD-10-CM | POA: Diagnosis not present

## 2022-05-10 DIAGNOSIS — F603 Borderline personality disorder: Secondary | ICD-10-CM

## 2022-05-10 DIAGNOSIS — R69 Illness, unspecified: Secondary | ICD-10-CM | POA: Diagnosis not present

## 2022-05-10 NOTE — Patient Instructions (Signed)
Continue lexapro 20 mg daily  Continue bupropion 450 mg daily Continue Abilify 20 mg daily Continue trazodone 25 -50 mg at night as needed  Referred for sleep evaluation Next appointment: 1/3 at 9 AM

## 2022-06-07 ENCOUNTER — Ambulatory Visit (INDEPENDENT_AMBULATORY_CARE_PROVIDER_SITE_OTHER): Payer: 59 | Admitting: Clinical

## 2022-06-07 DIAGNOSIS — F419 Anxiety disorder, unspecified: Secondary | ICD-10-CM | POA: Diagnosis not present

## 2022-06-07 DIAGNOSIS — F331 Major depressive disorder, recurrent, moderate: Secondary | ICD-10-CM | POA: Diagnosis not present

## 2022-06-07 DIAGNOSIS — R69 Illness, unspecified: Secondary | ICD-10-CM | POA: Diagnosis not present

## 2022-06-07 NOTE — Progress Notes (Signed)
Virtual Visit via Telephone Note  I connected with Troy Mahoney on 06/07/22 at 11:00 AM EST by telephone and verified that I am speaking with the correct person using two identifiers.  Location: Patient: Home Provider: Office   I discussed the limitations, risks, security and privacy concerns of performing an evaluation and management service by telephone and the availability of in person appointments. I also discussed with the patient that there may be a patient responsible charge related to this service. The patient expressed understanding and agreed to proceed.  THERAPIST PROGRESS NOTE   Session Time: 11:00AM-11:45AM   Participation Level: Active   Behavioral Response: CasualAlertDepressed   Type of Therapy: Individual Therapy   Treatment Goals addressed: Coping   Interventions: CBT, Motivational Interviewing, Solution Focused and Supportive   Summary: Troy Mahoney is a 31 y.o. male who presents with Depression and Anxiety The OPT therapist worked with the patient for his OPT treatment. The OPT therapist utilized Motivational Interviewing to assist in creating therapeutic repore. The patient in the session was engaged and spoke about ongoing external stressors of difficulty with socialization and employment. The OPT therapist utilized Cognitive Behavioral Therapy through cognitive restructuring as well as worked with the patient on coping strategies to assist in management of Depression and Anxiety. The OPT therapist worked with the patient to manage current stressors and use decision making steps to assist in making decisions. The OPT therapist worked with the patient on implementing a self care blueprint focusing of his basic needs areas of sleep,hygiene, exercise, and eating habits. The patient continued to identify not having employment and finances continue to be his current largest stressor, and in this session spoke about starting to realize he may have to take an offer to  work and bring in income even if it is not his ideal job.   Suicidal/Homicidal: Nowithout intent/plan   Therapist Response: The OPT therapist worked with the patient for the patients scheduled session. The patient was engaged in his session and gave feedback in relation to triggers, symptoms, and behavior responses over the past few weeks. The OPT therapist worked with the patient utilizing an in session Cognitive Behavioral Therapy exercise.The OPT therapist worked with the patient on postive thinking The patient was responsive in the session and verbalized, " I am realizing I may need to be more realistic and take a job offer so I can create income, I was turning down offers and getting a lot of offers but recently I have not been getting as many offers and I realized I may have to take a opportunity if I can find something where I can work from home". The patient  spoke about his challenge currently of finding a good fit for a job, however, realizing the urgency of getting a job. The OPT therapist worked with the patient on time management, prioritizing, and positive thinking.The OPT therapist will continue treatment work with the patient in his next scheduled session   Plan: Return again in 2/3 weeks.   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 Modesta Messing.  Patient/Guardian was advised Release of Information must be obtained prior to any record  release in order to collaborate their care with an outside provider. Patient/Guardian was advised if they have not already done so to contact the registration department to sign all necessary forms in order for [place] to release information regarding their care.    Consent: Patient/Guardian gives verbal consent for treatment and assignment of benefits for  services provided during this visit. Patient/Guardian expressed understanding and agreed to proceed.      I discussed the assessment and treatment plan with the patient. The patient was provided an opportunity to ask questions and all were answered. The patient agreed with the plan and demonstrated an understanding of the instructions.   The patient was advised to call back or seek an in-person evaluation if the symptoms worsen or if the condition fails to improve as anticipated.   I provided 45 minutes of non-face-to-face time during this encounter   Aurther Loft T.Montez Morita, LCSW   06/07/2022

## 2022-06-23 NOTE — Progress Notes (Signed)
Virtual Visit via Video Note  I connected with Troy Mahoney on 07/06/22 at  9:00 AM EST by a video enabled telemedicine application and verified that I am speaking with the correct person using two identifiers.  Location: Patient: home Provider: office Persons participated in the visit- patient, provider    I discussed the limitations of evaluation and management by telemedicine and the availability of in person appointments. The patient expressed understanding and agreed to proceed.    I discussed the assessment and treatment plan with the patient. The patient was provided an opportunity to ask questions and all were answered. The patient agreed with the plan and demonstrated an understanding of the instructions.   The patient was advised to call back or seek an in-person evaluation if the symptoms worsen or if the condition fails to improve as anticipated.  I provided 17 minutes of non-face-to-face time during this encounter.   [name], MD    [facility] MD/PA/NP OP Progress Note  07/06/2022 9:31 AM REMIGIO Mahoney  MRN:  [id number]  Chief Complaint:  Chief Complaint  Patient presents with   Follow-up   HPI:  This is a follow-up appointment for depression, borderline personality disorder and insomnia.  He states that he is a little bit concerned.  He has been feeling down for the past 2 weeks.  It has been hard to do things such as cooking.  He also tends to be hyper fixed on tasks.  He tries to drink coffee in the morning.  He had a best Christmas ever in the last few years.  He was by himself.  He states that he feels content being by himself as his mood is not good.  The relationship with his mother is going very well.  It has been also helpful for him to understand her mother's personal losses.  He has lack of transportation/stuck in the house as her mother is working 2 jobs as a Equities trader.  He sleeps 12 hours.  He sleeps around 5 PM, feeling tired.  He agrees to  try to stay awake until later in the evening.  He denies SI.  He denies alcohol use or drug use.  He thinks he might be having some tendency to feeling down and on winter season; he is interested in trying light therapy.    Wt Readings from Last 3 Encounters:  03/29/22 271 lb (122.9 kg)  09/25/18 248 lb (112.5 kg)  08/28/18 251 lb (113.9 kg)      Visit Diagnosis:    ICD-10-CM   1. MDD (major depressive disorder), recurrent episode, mild (HCC)  F33.0     2. Borderline personality disorder (HCC)  F60.3     3. Insomnia, unspecified type  G47.00       Past Psychiatric History: Please see initial evaluation for full details. I have reviewed the history. No updates at this time.     Past Medical History: No past medical history on file. No past surgical history on file.  Family Psychiatric History: Please see initial evaluation for full details. I have reviewed the history. No updates at this time.     Family History:  Family History  Problem Relation Age of Onset   Anxiety disorder Mother    Depression Mother    Anxiety disorder Maternal Aunt    Anxiety disorder Maternal Grandmother     Social History:  Social History   Socioeconomic History   Marital status: Single    Spouse name: Not  on file   Number of children: Not on file   Years of education: Not on file   Highest education level: Not on file  Occupational History   Not on file  Tobacco Use   Smoking status: Never   Smokeless tobacco: Never  Substance and Sexual Activity   Alcohol use: Yes    Comment: occ.   Drug use: No   Sexual activity: Not on file  Other Topics Concern   Not on file  Social History Narrative   Not on file   Social Determinants of Health   Financial Resource Strain: Not on file  Food Insecurity: Not on file  Transportation Needs: Not on file  Physical Activity: Not on file  Stress: Not on file  Social Connections: Not on file    Allergies: No Known Allergies  Metabolic  Disorder Labs: No results found for: "HGBA1C", "MPG" No results found for: "PROLACTIN" No results found for: "CHOL", "TRIG", "HDL", "CHOLHDL", "VLDL", "LDLCALC" Lab Results  Component Value Date   TSH 0.69 08/28/2018    Therapeutic Level Labs: No results found for: "LITHIUM" No results found for: "VALPROATE" No results found for: "CBMZ"  Current Medications: Current Outpatient Medications  Medication Sig Dispense Refill   amLODipine (NORVASC) 5 MG tablet TAKE 1 TABLET BY MOUTH DAILY WITH OLMERSARTAN     ARIPiprazole (ABILIFY) 20 MG tablet Take 1 tablet (20 mg total) by mouth daily. 90 tablet 0   buPROPion (WELLBUTRIN XL) 150 MG 24 hr tablet Take 3 tablets (450 mg total) by mouth daily. 270 tablet 1   escitalopram (LEXAPRO) 20 MG tablet Take 1 tablet (20 mg total) by mouth daily. 90 tablet 1   hydrochlorothiazide (MICROZIDE) 12.5 MG capsule Take 12.5 mg by mouth daily.     ibuprofen (ADVIL,MOTRIN) 200 MG tablet Take 200 mg by mouth every 6 (six) hours as needed for mild pain or moderate pain.     olmesartan (BENICAR) 20 MG tablet TAKE 1 TABLET BY MOUTH DAILY FOR BLOOD PRESSURE WITH AMLODIPINE     traZODone (DESYREL) 50 MG tablet Take 1 tablet (50 mg total) by mouth at bedtime as needed for sleep. 90 tablet 0   No current facility-administered medications for this visit.     Musculoskeletal: Strength & Muscle Tone:  N/A Gait & Station:  N/A Patient leans: N/A  Psychiatric Specialty Exam: Review of Systems  Psychiatric/Behavioral:  Positive for dysphoric mood and sleep disturbance. Negative for agitation, behavioral problems, confusion, decreased concentration, hallucinations, self-injury and suicidal ideas. The patient is not nervous/anxious and is not hyperactive.   All other systems reviewed and are negative.   There were no vitals taken for this visit.There is no height or weight on file to calculate BMI.  General Appearance: Fairly Groomed  Eye Contact:  Good  Speech:   Clear and Coherent  Volume:  Normal  Mood:  Depressed  Affect:  Appropriate, Congruent, and down  Thought Process:  Coherent  Orientation:  Full (Time, Place, and Person)  Thought Content: Logical   Suicidal Thoughts:  No  Homicidal Thoughts:  No  Memory:  Immediate;   Good  Judgement:  Good  Insight:  Good  Psychomotor Activity:  Normal  Concentration:  Concentration: Good and Attention Span: Good  Recall:  Good  Fund of Knowledge: Good  Language: Good  Akathisia:  No  Handed:  Right  AIMS (if indicated): not done  Assets:  Communication Skills Desire for Improvement  ADL's:  Intact  Cognition: WNL  Sleep:   hypersomnia   Screenings: GAD-7    Flowsheet Row Office Visit from confidential encounter on 03/29/2022 [facility]  Total GAD-7 Score 12 16      PHQ2-9    Flowsheet Row Office Visit from confidential encounter on 03/29/2022 [facility] Video Visit from confidential encounter on 07/14/2021 Video Visit from confidential encounter on 03/10/2021 Video Visit from confidential encounter on 11/03/2020  PHQ-2 Total Score 4 3 4 2 2   PHQ-9 Total Score 11 10 19 11 11       Flowsheet Row Counsel[facility]23[facility]2021 [facility] Video Visit from confidential encounter on 07/14/2021 Video Visit from confidential encounter on 03/10/2021  C-SSRS RISK CATEGORY Error: Q3, 4, or 5 should not be populated when Q2 is No Error: Q3, 4, or 5 should not be populated when Q2 is No Error: Q3, 4, or 5 should not be populated when Q2 is No        Assessment and Plan:  Troy Mahoney is a 31 y.o. year old male with a history of depression, hypertension , who presents for follow up appointment for below.   1. MDD (major depressive disorder), recurrent episode, mild (HCC) 2. Borderline personality disorder (HCC) #  r/o with seasonal pattern There has been slight worsening in depressive symptoms, which she partly attributes to seasonal change. Psychosocial stressors includes financial strain, lack of transportation, many losses through his life including his father, and conflict with his sister.  He agrees to stay on the current medication regimen while working on behavioral activation/structured daily routine, and starting light therapy.  Will continue Lexapro, bupropion and Abilify to target depression.   3. Insomnia, unspecified type He started to have hypersomnia.  Will hold trazodone at this time to avoid somnolence. Noted that although he was recommended for sleep evaluation, it is on hold now due to lack of transportation.    Plan  Continue lexapro 20 mg daily  Continue bupropion 450 mg daily Continue Abilify 20 mg daily Continue trazodone 25 -50 mg at night as needed  Referred for sleep evaluation Recommend light therapy Next appointment: 1/26 at 9 AM for 30 mins, video [email or web address]    Past trials of medication: sertraline (insomnia) , bupropion, lamotrigine (weight gain)              The patient demonstrates the following risk factors for suicide: Chronic risk factors for suicide include: psychiatric disorder of depression. Acute risk factors for suicide include: loss (financial, interpersonal, professional). Protective factors for this patient include: responsibility to others (children, family), coping skills and hope for the future. Considering these factors, the overall suicide risk at this point appears to be moderate, but not at imminent risk. He denies gun access at home. Patient is appropriate for outpatient follow up.        Collaboration of Care: Collaboration of Care: Other reviewed notes in Epic  Patient/Guardian was advised Release of Information must be obtained prior to any record release in order to collaborate their care with an outside provider. Patient/Guardian was  advised if they have not already done so to contact the registration department to sign all necessary forms in order for us to release information regarding their care.   Consent: Patient/Guardian gives verbal consent for treatment and assignment of benefits for services provided during this visit. Patient/Guardian expressed understanding and agreed to proceed.    Neysa Hottereina Chamberlain Steinborn, MD  07/06/2022, 9:31 AM

## 2022-07-06 ENCOUNTER — Telehealth (INDEPENDENT_AMBULATORY_CARE_PROVIDER_SITE_OTHER): Payer: 59 | Admitting: Psychiatry

## 2022-07-06 ENCOUNTER — Encounter: Payer: Self-pay | Admitting: Psychiatry

## 2022-07-06 DIAGNOSIS — F603 Borderline personality disorder: Secondary | ICD-10-CM

## 2022-07-06 DIAGNOSIS — F330 Major depressive disorder, recurrent, mild: Secondary | ICD-10-CM | POA: Diagnosis not present

## 2022-07-06 DIAGNOSIS — G4700 Insomnia, unspecified: Secondary | ICD-10-CM

## 2022-07-06 DIAGNOSIS — R69 Illness, unspecified: Secondary | ICD-10-CM | POA: Diagnosis not present

## 2022-07-06 NOTE — Patient Instructions (Signed)
Continue lexapro 20 mg daily  Continue bupropion 450 mg daily Continue Abilify 20 mg daily Continue trazodone 25 -50 mg at night as needed  Referred for sleep evaluation Recommend light therapy - please see the instruction below Next appointment: 1/26 at 9 AM  Generally, the light box should: Emit full-spectrum light with either fluorescent or LED bulbs (fluorescent is usually recommended) Provide an exposure to 10,000 lux of light Produce as little UV light as possible  Typical recommendations include using the light box: Within the 30 mins to first hour of waking up in the morning For about 20 to 30 minutes About 16 to 24 inches (41 to 61 centimeters) from your face, but follow the manufacturer's instructions about distance With eyes open, but not looking directly at the light  Noted that Light boxes aren't regulated by the Food and Drug Administration (FDA) for SAD (seasonal affective disorder) treatment.

## 2022-07-12 ENCOUNTER — Ambulatory Visit (INDEPENDENT_AMBULATORY_CARE_PROVIDER_SITE_OTHER): Payer: 59 | Admitting: Clinical

## 2022-07-12 DIAGNOSIS — F331 Major depressive disorder, recurrent, moderate: Secondary | ICD-10-CM

## 2022-07-12 DIAGNOSIS — F419 Anxiety disorder, unspecified: Secondary | ICD-10-CM

## 2022-07-12 DIAGNOSIS — R69 Illness, unspecified: Secondary | ICD-10-CM | POA: Diagnosis not present

## 2022-07-12 NOTE — Progress Notes (Signed)
Virtual Visit via Video Note  I connected with Troy Mahoney on 07/12/22 at 11:00 AM EST by a video enabled telemedicine application and verified that I am speaking with the correct person using two identifiers.  Location: Patient: Home Provider: Office   I discussed the limitations of evaluation and management by telemedicine and the availability of in person appointments. The patient expressed understanding and agreed to proceed.    THERAPIST PROGRESS NOTE   Session Time: 11:00AM-11:45AM   Participation Level: Active   Behavioral Response: CasualAlertDepressed   Type of Therapy: Individual Therapy   Treatment Goals addressed: Coping   Interventions: CBT, Motivational Interviewing, Solution Focused and Supportive   Summary: Troy Mahoney is a 32 y.o. male who presents with Depression and Anxiety The OPT therapist worked with the patient for his OPT treatment. The OPT therapist utilized Motivational Interviewing to assist in creating therapeutic repore. The patient in the session was engaged and spoke about ongoing external stressors of difficulty with socialization and employment. The patient spoke about the loss of his Father and the recent holidays. The patient spoke about his mood pattern.The OPT therapist utilized Cognitive Behavioral Therapy through cognitive restructuring as well as worked with the patient on coping strategies to assist in management of Depression and Anxiety. The OPT therapist worked with the patient to manage current stressors and use decision making steps to assist in making decisions. The OPT therapist worked with the patient on implementing light therapy.   Suicidal/Homicidal: Nowithout intent/plan   Therapist Response: The OPT therapist worked with the patient for the patients scheduled session. The patient was engaged in his session and gave feedback in relation to triggers, symptoms, and behavior responses over the past few weeks. The OPT therapist  worked with the patient utilizing an in session Cognitive Behavioral Therapy exercise.The OPT therapist worked with the patient on postive thinking The patient was responsive in the session and verbalized, " I am realizing I may need to be more realistic in how much I achieve in one day and try to pace myself and work on one thing at a time". The patient continues to look for employement. The OPT therapist worked with the patient on time management, prioritizing, and positive thinking.The OPT therapist will continue treatment work with the patient in his next scheduled session   Plan: Return again in 2/3 weeks.   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 Modesta Messing.   Patient/Guardian was advised Release of Information must be obtained prior to any record release in order to collaborate their care with an outside provider. Patient/Guardian was advised if they have not already done so to contact the registration department to sign all necessary forms in order for [place] to release information regarding their care.    Consent: Patient/Guardian gives verbal consent for treatment and assignment of benefits for services provided during this visit. Patient/Guardian expressed understanding and agreed to proceed.      I discussed the assessment and treatment plan with the patient. The patient was provided an opportunity to ask questions and all were answered. The patient agreed with the plan and demonstrated an understanding of the instructions.  The patient was advised to call back or seek an in-person evaluation if the symptoms worsen or if the condition fails to improve as anticipated.   I provided 45 minutes of non-face-to-face time during this encounter   Aurther Loft T.Montez Morita, LCSW   07/12/2022

## 2022-07-28 NOTE — Progress Notes (Signed)
Virtual Visit via Video Note  I connected with Troy Mahoney on 07/29/22 at  9:00 AM EST by a video enabled telemedicine application and verified that I am speaking with the correct person using two identifiers.  Location: Patient: home Provider: office Persons participated in the visit- patient, provider    I discussed the limitations of evaluation and management by telemedicine and the availability of in person appointments. The patient expressed understanding and agreed to proceed.    I discussed the assessment and treatment plan with the patient. The patient was provided an opportunity to ask questions and all were answered. The patient agreed with the plan and demonstrated an understanding of the instructions.   The patient was advised to call back or seek an in-person evaluation if the symptoms worsen or if the condition fails to improve as anticipated.  I provided 15 minutes of non-face-to-face time during this encounter.   Norman Clay, MD    [facility] MD/PA/NP OP Progress Note  07/29/2022 9:31 AM Troy Mahoney  MRN:  [id number]  Chief Complaint:  Chief Complaint  Patient presents with   Follow-up   HPI:  This is a follow-up appointment for depression and borderline personality disorder.  He states that his mood has been improving.  He stopped drinking coffee for the past few weeks.  He used to drink up to 3 coffees a day.  He does not feel anxious anymore.  He states that it has been awful when he was asked about job searching.  He feels down when he was declined by 3 jobs at one time.  It has never happened before.  He is also concerned due to lack of transportation as many of the job opportunity has been transitioned back to the office.  He is planning to get the car once he is able to have a job.  He has been sleeping better.  He tries to keep himself mobile.  He likes it so far.  He does stress eating including greasy food.  He denies SI.  He has not been able to use his  light box due to financial strain.  He denies alcohol use or drug use.  He is willing to try topiramate at this time.   Visit Diagnosis:    ICD-10-CM   1. MDD (major depressive disorder), recurrent episode, mild ([place])  F33.0     2. Borderline personality disorder ([place])  F60.3     3. Binge eating  R63.2       Past Psychiatric History: Please see initial evaluation for full details. I have reviewed the history. No updates at this time.     Past Medical History: No past medical history on file. No past surgical history on file.  Family Psychiatric History: Please see initial evaluation for full details. I have reviewed the history. No updates at this time.     Family History:  Family History  Problem Relation Age of Onset   Anxiety disorder Mother    Depression Mother    Anxiety disorder Maternal Aunt    Anxiety disorder Maternal Grandmother     Social History:  Social History   Socioeconomic History   Marital status: Single    Spouse name: Not on file   Number of children: Not on file   Years of education: Not on file   Highest education level: Not on file  Occupational History   Not on file  Tobacco Use   Smoking status: Never   Smokeless tobacco:  Never  Substance and Sexual Activity   Alcohol use: Yes    Comment: occ.   Drug use: No   Sexual activity: Not on file  Other Topics Concern   Not on file  Social History Narrative   Not on file   Social Determinants of Health   Financial Resource Strain: Not on file  Food Insecurity: Not on file  Transportation Needs: Not on file  Physical Activity: Not on file  Stress: Not on file  Social Connections: Not on file    Allergies: No Known Allergies  Metabolic Disorder Labs: No results found for: "HGBA1C", "MPG" No results found for: "PROLACTIN" No results found for: "CHOL", "TRIG", "HDL", "CHOLHDL", "VLDL", "LDLCALC" Lab Results  Component Value Date   TSH 0.69 08/28/2018    Therapeutic Level Labs: No  results found for: "LITHIUM" No results found for: "VALPROATE" No results found for: "CBMZ"  Current Medications: Current Outpatient Medications  Medication Sig Dispense Refill   topiramate (TOPAMAX) 25 MG tablet Take 1 tablet (25 mg total) by mouth at bedtime. 30 tablet 1   amLODipine (NORVASC) 5 MG tablet TAKE 1 TABLET BY MOUTH DAILY WITH OLMERSARTAN     ARIPiprazole (ABILIFY) 20 MG tablet Take 1 tablet (20 mg total) by mouth daily. 90 tablet 0   buPROPion (WELLBUTRIN XL) 150 MG 24 hr tablet Take 3 tablets (450 mg total) by mouth daily. 270 tablet 1   escitalopram (LEXAPRO) 20 MG tablet Take 1 tablet (20 mg total) by mouth daily. 90 tablet 1   hydrochlorothiazide (MICROZIDE) 12.5 MG capsule Take 12.5 mg by mouth daily.     ibuprofen (ADVIL,MOTRIN) 200 MG tablet Take 200 mg by mouth every 6 (six) hours as needed for mild pain or moderate pain.     olmesartan (BENICAR) 20 MG tablet TAKE 1 TABLET BY MOUTH DAILY FOR BLOOD PRESSURE WITH AMLODIPINE     traZODone (DESYREL) 50 MG tablet Take 1 tablet (50 mg total) by mouth at bedtime as needed for sleep. 90 tablet 0   No current facility-administered medications for this visit.     Musculoskeletal: Strength & Muscle Tone:  N/A Gait & Station:  N/A Patient leans: N/A  Psychiatric Specialty Exam: Review of Systems  Psychiatric/Behavioral:  Positive for dysphoric mood. Negative for agitation, behavioral problems, confusion, decreased concentration, hallucinations, self-injury, sleep disturbance and suicidal ideas. The patient is nervous/anxious. The patient is not hyperactive.   All other systems reviewed and are negative.   There were no vitals taken for this visit.There is no height or weight on file to calculate BMI.  General Appearance: Fairly Groomed  Eye Contact:  Good  Speech:  Clear and Coherent  Volume:  Normal  Mood:   better  Affect:  Appropriate, Congruent, and calm  Thought Process:  Coherent  Orientation:  Full (Time,  Place, and Person)  Thought Content: Logical   Suicidal Thoughts:  No  Homicidal Thoughts:  No  Memory:  Immediate;   Good  Judgement:  Good  Insight:  Good  Psychomotor Activity:  Normal  Concentration:  Concentration: Good and Attention Span: Good  Recall:  Good  Fund of Knowledge: Good  Language: Good  Akathisia:  No  Handed:  Right  AIMS (if indicated): not done  Assets:  Communication Skills Desire for Improvement  ADL's:  Intact  Cognition: WNL  Sleep:  Fair   Screenings: GAD-7    [place] Office Visit from confidential encounter on 03/29/2022 Counselor from 08/23/2021 in [facility]  Total GAD-7 Score 12 16      PHQ2-9    Flowsheet Row Office Visit from confidential encounter on 03/29/2022 Counselor from 08/23/2021 in [facility] at [place] Video Visit from confidential encounter on 07/14/2021 Video Visit from confidential encounter on 03/10/2021 Video Visit from confidential encounter on 11/03/2020  PHQ-2 Total Score 4 3 4 2 2   PHQ-9 Total Score 11 10 19 11 11       Flowsheet Row Counselor from 08/23/2021 in [facility] [place] Video Visit from confidential encounter on 07/14/2021 Video Visit from confidential encounter on 03/10/2021  C-SSRS RISK CATEGORY Error: Q3, 4, or 5 should not be populated when Q2 is No Error: Q3, 4, or 5 should not be populated when Q2 is No Error: Q3, 4, or 5 should not be populated when Q2 is No        Assessment and Plan:  Troy Mahoney is a 32 y.o. year old male with a history of depression, hypertension, who presents for follow up appointment for below.    1. MDD (major depressive disorder), recurrent episode, mild (HCC) 2. Borderline personality disorder (HCC) # r/o with seasonal pattern Acute stressors include:unemployment,  Other stressors include:financial strain, lack of transportation,  loss of his father, his sister      History:   Overall improving in the context of refraining from caffeine use.  Will continue current medication regimen and continue to work on behavioral activation.  Will continue Lexapro, bupropion and Abilify to target depression.   3. Binge eating He reports stress in relation to binge eating.  Will try topiramate to target this, which will be beneficial for weight gain associated with antipsychotic use.  Discussed potential risk of drowsiness.   3. Insomnia, unspecified type Improving. Will continue trazodone prn for insomnia. Noted that although he was recommended for sleep evaluation, it is on hold now due to lack of transportation.    Plan  Continue lexapro 20 mg daily  Continue bupropion 450 mg daily Continue Abilify 20 mg daily Start topiramate 25 mg at night Continue trazodone 25 -50 mg at night as needed  Referred for sleep evaluation Recommend light therapy Next appointment: 3/18 at 9 AM for 30 mins, video [email or web address]    Past trials of medication: sertraline (insomnia) , bupropion, lamotrigine (weight gain)              The patient demonstrates the following risk factors for suicide: Chronic risk factors for suicide include: psychiatric disorder of depression. Acute risk factors for suicide include: loss (financial, interpersonal, professional). Protective factors for this patient include: responsibility to others (children, family), coping skills and hope for the future. Considering these factors, the overall suicide risk at this point appears to be moderate, but not at imminent risk. He denies gun access at home. Patient is appropriate for outpatient follow up.           Collaboration of Care: Collaboration of Care: Other reviewed notes in epic  Patient/Guardian was advised Release of Information must be obtained prior to any record release in order to collaborate their care with an outside provider. Patient/Guardian was advised if they have not already done so  to contact the registration department to sign all necessary forms in order for 38 to release information regarding their care.   Consent: Patient/Guardian gives verbal consent for treatment and assignment of benefits for services provided during this visit. Patient/Guardian expressed understanding and agreed  to proceed.    [name], MD 07/29/2022, 9:31 AM

## 2022-07-29 ENCOUNTER — Encounter: Payer: Self-pay | Admitting: Psychiatry

## 2022-07-29 ENCOUNTER — Telehealth (INDEPENDENT_AMBULATORY_CARE_PROVIDER_SITE_OTHER): Payer: 59 | Admitting: Psychiatry

## 2022-07-29 DIAGNOSIS — F330 Major depressive disorder, recurrent, mild: Secondary | ICD-10-CM | POA: Diagnosis not present

## 2022-07-29 DIAGNOSIS — R632 Polyphagia: Secondary | ICD-10-CM

## 2022-07-29 DIAGNOSIS — F603 Borderline personality disorder: Secondary | ICD-10-CM

## 2022-07-29 DIAGNOSIS — R69 Illness, unspecified: Secondary | ICD-10-CM | POA: Diagnosis not present

## 2022-07-29 MED ORDER — ARIPIPRAZOLE 20 MG PO TABS: 20.0000 mg | ORAL_TABLET | Freq: Every day | ORAL | 0 refills | Status: DC

## 2022-07-29 MED ORDER — TOPIRAMATE 25 MG PO TABS: 25.0000 mg | ORAL_TABLET | Freq: Every day | ORAL | 1 refills | Status: DC

## 2022-07-29 NOTE — Patient Instructions (Addendum)
Continue lexapro 20 mg daily  Continue bupropion 450 mg daily Continue Abilify 20 mg daily Start topiramate 25 mg at night Continue trazodone 25 -50 mg at night as needed  Referred for sleep evaluation Recommend light therapy Next appointment: 3/18 at 9 AM

## 2022-08-02 ENCOUNTER — Ambulatory Visit (INDEPENDENT_AMBULATORY_CARE_PROVIDER_SITE_OTHER): Payer: 59 | Admitting: Clinical

## 2022-08-02 DIAGNOSIS — F419 Anxiety disorder, unspecified: Secondary | ICD-10-CM | POA: Diagnosis not present

## 2022-08-02 DIAGNOSIS — F331 Major depressive disorder, recurrent, moderate: Secondary | ICD-10-CM

## 2022-08-02 DIAGNOSIS — R69 Illness, unspecified: Secondary | ICD-10-CM | POA: Diagnosis not present

## 2022-08-02 NOTE — Progress Notes (Signed)
Virtual Visit via Video Note   I connected with Troy Mahoney on 08/02/22 at 1:00 PM EST by a video enabled telemedicine application and verified that I am speaking with the correct person using two identifiers.   Location: Patient: Home Provider: Office   I discussed the limitations of evaluation and management by telemedicine and the availability of in person appointments. The patient expressed understanding and agreed to proceed.       THERAPIST PROGRESS NOTE   Session Time: 1:00 PM-1:45 PM   Participation Level: Active   Behavioral Response: CasualAlertDepressed   Type of Therapy: Individual Therapy   Treatment Goals addressed: Coping   Interventions: CBT, Motivational Interviewing, Solution Focused and Supportive   Summary: Troy Mahoney is a 32 y.o. male who presents with Depression and Anxiety The OPT therapist worked with the patient for his OPT treatment. The OPT therapist utilized Motivational Interviewing to assist in creating therapeutic repore. The patient in the session was engaged and spoke about ongoing external stressors of difficulty with socialization and employment. The patient spoke about stopping drinking coffee and this has helped him to regulate his sleep cycle. The patient spoke about his mood pattern and doing better even when he has been getting rejected from job prospective.The OPT therapist utilized Cognitive Behavioral Therapy through cognitive restructuring as well as worked with the patient on coping strategies to assist in management of Depression and Anxiety. The OPT therapist worked with the patient to manage current stressors and use decision making steps to assist in making decisions. The OPT therapist worked with the patient on implementing light therapy.   Suicidal/Homicidal: Nowithout intent/plan   Therapist Response: The OPT therapist worked with the patient for the patients scheduled session. The patient was engaged in his session and gave  feedback in relation to triggers, symptoms, and behavior responses over the past few weeks. The OPT therapist worked with the patient utilizing an in session Cognitive Behavioral Therapy exercise.The OPT therapist worked with the patient on postive thinking The patient was responsive in the session and verbalized, " I have decided to hire someone to help me with my resume and pointers on applying for a job". The patient continues to look for employement. The OPT therapist worked with the patient on time management, prioritizing, and positive thinking. The patient has been incorporating more physical exercise and this has helped regulate his sleep cycle. The OPT therapist will continue treatment work with the patient in his next scheduled session   Plan: Return again in 2/3 weeks.   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 Modesta Messing.   Patient/Guardian was advised Release of Information must be obtained prior to any record release in order to collaborate their care with an outside provider. Patient/Guardian was advised if they have not already done so to contact the registration department to sign all necessary forms in order for [place] to release information regarding their care.    Consent: Patient/Guardian gives verbal consent for treatment and assignment of benefits for services provided during this visit. Patient/Guardian expressed understanding and agreed to proceed.      I discussed the assessment and treatment plan  with the patient. The patient was provided an opportunity to ask questions and all were answered. The patient agreed with the plan and demonstrated an understanding of the instructions.   The patient was advised to call back or seek an in-person evaluation if the symptoms worsen  or if the condition fails to improve as anticipated.   I provided 45 minutes of non-face-to-face time during this encounter   Coralyn Mark T.Eulas Post, [place]   08/02/2022

## 2022-09-02 ENCOUNTER — Ambulatory Visit (HOSPITAL_COMMUNITY): Payer: 59 | Admitting: Clinical

## 2022-09-17 NOTE — Progress Notes (Unsigned)
Virtual Visit via Video Note  I connected with Troy Mahoney on 09/19/22 at  9:00 AM EDT by a video enabled telemedicine application and verified that I am speaking with the correct person using two identifiers.  Location: Patient: home Provider: office Persons participated in the visit- patient, provider    I discussed the limitations of evaluation and management by telemedicine and the availability of in person appointments. The patient expressed understanding and agreed to proceed.    I discussed the assessment and treatment plan with the patient. The patient was provided an opportunity to ask questions and all were answered. The patient agreed with the plan and demonstrated an understanding of the instructions.   The patient was advised to call back or seek an in-person evaluation if the symptoms worsen or if the condition fails to improve as anticipated.  I provided 15 minutes of non-face-to-face time during this encounter.   Norman Clay, MD    [facility] MD/PA/NP OP Progress Note  09/19/2022 9:27 AM Troy Mahoney  MRN:  [id number]  Chief Complaint:  Chief Complaint  Patient presents with   Follow-up   HPI:  This is a follow-up appointment for depression, binge eating and insomnia.  He states that he is on the journey of finding employment.  It is frustrating as he is constantly denied for the position.  Although there were some times he feels down about this, he finds having mental health date to be very helpful.  Although he feels guilty about this at times, he knows it is working well.  His mood has been good otherwise.  He also finds topiramate to be very helpful for binge eating.  Although he was feeling drowsy when he first started taking medication, it has been better.  He takes it in the morning as it helps more for craving during the day.  He sleeps well.  He denies SI.  He denies alcohol use or drug use.  He would like to try higher dose of topiramate at this time as  he still experiences cravings.  Although he has not done any exercise lately, he is willing to go outside even for a few minutes.   269 lbs Wt Readings from Last 3 Encounters:  03/29/22 271 lb (122.9 kg)  09/25/18 248 lb (112.5 kg)  08/28/18 251 lb (113.9 kg)     Visit Diagnosis:    ICD-10-CM   1. MDD (major depressive disorder), recurrent, in partial remission ([place])  F33.41     2. Borderline personality disorder ([place])  F60.3     3. Binge eating  R63.2     4. Insomnia, unspecified type  G47.00       Past Psychiatric History: Please see initial evaluation for full details. I have reviewed the history. No updates at this time.     Past Medical History: No past medical history on file. No past surgical history on file.  Family Psychiatric History: Please see initial evaluation for full details. I have reviewed the history. No updates at this time.     Family History:  Family History  Problem Relation Age of Onset   Anxiety disorder Mother    Depression Mother    Anxiety disorder Maternal Aunt    Anxiety disorder Maternal Grandmother     Social History:  Social History   Socioeconomic History   Marital status: Single    Spouse name: Not on file   Number of children: Not on file   Years of education:  Not on file   Highest education level: Not on file  Occupational History   Not on file  Tobacco Use   Smoking status: Never   Smokeless tobacco: Never  Substance and Sexual Activity   Alcohol use: Yes    Comment: occ.   Drug use: No   Sexual activity: Not on file  Other Topics Concern   Not on file  Social History Narrative   Not on file   Social Determinants of Health   Financial Resource Strain: Not on file  Food Insecurity: Not on file  Transportation Needs: Not on file  Physical Activity: Not on file  Stress: Not on file  Social Connections: Not on file    Allergies: No Known Allergies  Metabolic Disorder Labs: No results found for: "HGBA1C",  "MPG" No results found for: "PROLACTIN" No results found for: "CHOL", "TRIG", "HDL", "CHOLHDL", "VLDL", "LDLCALC" Lab Results  Component Value Date   TSH 0.69 08/28/2018    Therapeutic Level Labs: No results found for: "LITHIUM" No results found for: "VALPROATE" No results found for: "CBMZ"  Current Medications: Current Outpatient Medications  Medication Sig Dispense Refill   amLODipine (NORVASC) 5 MG tablet TAKE 1 TABLET BY MOUTH DAILY WITH OLMERSARTAN     ARIPiprazole (ABILIFY) 20 MG tablet Take 1 tablet (20 mg total) by mouth daily. 90 tablet 0   buPROPion (WELLBUTRIN XL) 150 MG 24 hr tablet Take 3 tablets (450 mg total) by mouth daily. 270 tablet 1   escitalopram (LEXAPRO) 20 MG tablet Take 1 tablet (20 mg total) by mouth daily. 90 tablet 1   hydrochlorothiazide (MICROZIDE) 12.5 MG capsule Take 12.5 mg by mouth daily.     ibuprofen (ADVIL,MOTRIN) 200 MG tablet Take 200 mg by mouth every 6 (six) hours as needed for mild pain or moderate pain.     olmesartan (BENICAR) 20 MG tablet TAKE 1 TABLET BY MOUTH DAILY FOR BLOOD PRESSURE WITH AMLODIPINE     topiramate (TOPAMAX) 25 MG tablet Take 1 tablet (25 mg total) by mouth 2 (two) times daily. 60 tablet 1   traZODone (DESYREL) 50 MG tablet Take 1 tablet (50 mg total) by mouth at bedtime as needed for sleep. 90 tablet 0   No current facility-administered medications for this visit.     Musculoskeletal: Strength & Muscle Tone:  N/A Gait & Station:  N/A Patient leans: N/A  Psychiatric Specialty Exam: Review of Systems  Psychiatric/Behavioral:  Positive for dysphoric mood. Negative for agitation, behavioral problems, confusion, decreased concentration, hallucinations, self-injury, sleep disturbance and suicidal ideas. The patient is not nervous/anxious and is not hyperactive.   All other systems reviewed and are negative.   There were no vitals taken for this visit.There is no height or weight on file to calculate BMI.  General  Appearance: Fairly Groomed  Eye Contact:  Good  Speech:  Clear and Coherent  Volume:  Normal  Mood:   good  Affect:  Appropriate and Congruent  Thought Process:  Coherent  Orientation:  Full (Time, Place, and Person)  Thought Content: Logical   Suicidal Thoughts:  No  Homicidal Thoughts:  No  Memory:  Immediate;   Good  Judgement:  Good  Insight:  Good  Psychomotor Activity:  Normal  Concentration:  Concentration: Good and Attention Span: Good  Recall:  Good  Fund of Knowledge: Good  Language: Good  Akathisia:  No  Handed:  Right  AIMS (if indicated): not done  Assets:  Communication Skills Desire for Improvement  ADL's:  Intact  Cognition: WNL  Sleep:  Good   Screenings: GAD-7    Flowsheet Row Office Visit from confidential encounter on 03/29/2022 Counselor from 08/23/2021 in [place] at [place]  Total GAD-7 Score 12 16      PHQ2-9    [place] Office Visit from confidential encounter on 03/29/2022 Counselor from 08/23/2021 in [place] at [place] Video Visit from confidential encounter on 07/14/2021 Video Visit from confidential encounter on 03/10/2021 Video Visit from confidential encounter on 11/03/2020  PHQ-2 Total Score 4 3 4 2 2   PHQ-9 Total Score 11 10 19 11 11       Flowsheet Row Counselor from 08/23/2021 in [place] at [place] Video Visit from confidential encounter on 07/14/2021 Video Visit from confidential encounter on 03/10/2021  C-SSRS RISK CATEGORY Error: Q3, 4, or 5 should not be populated when Q2 is No Error: Q3, 4, or 5 should not be populated when Q2 is No Error: Q3, 4, or 5 should not be populated when Q2 is No        Assessment and Plan:  Troy Mahoney is a 32 y.o. year old male with a history of depression, hypertension, who presents for follow up appointment for below.   1. MDD (major depressive disorder), recurrent, in partial remission  ([place]) 2. Borderline personality disorder ([place]) # r/o with seasonal pattern Acute stressors include:unemployment, applying for jobs Other stressors include:financial strain, lack of transportation,  loss of his father, his sister     History:   There has been overall improvement in depressive symptoms since the last visit.  Will continue Lexapro, bupropion and Abilify to target depression.  Coached behavioral activation.   3. Binge eating Significant improvement since starting topiramate.  Will do further uptitration to target binge eating, and weight gain associated with antipsychotic use.  Discussed risk of drowsiness.   4. Insomnia, unspecified type Improving.  Will continue trazodone as needed for insomnia.  Noted that although he was recommended for sleep evaluation, it is on hold now due to lack of transportation.    Plan  Continue lexapro 20 mg daily  Continue bupropion 450 mg daily Continue Abilify 20 mg daily Increase topiramate 25 mg twice a day Continue trazodone 25 -50 mg at night as needed  Referred for sleep evaluation Next appointment: 4/24 at 11:30 for 30 mins, video [email or web address]    Past trials of medication: sertraline (insomnia) , bupropion, lamotrigine (weight gain)              The patient demonstrates the following risk factors for suicide: Chronic risk factors for suicide include: psychiatric disorder of depression. Acute risk factors for suicide include: loss (financial, interpersonal, professional). Protective factors for this patient include: responsibility to others (children, family), coping skills and hope for the future. Considering these factors, the overall suicide risk at this point appears to be moderate, but not at imminent risk. He denies gun access at home. Patient is appropriate for outpatient follow up.    Collaboration of Care: Collaboration of Care: Other reviewed notes in Epic  Patient/Guardian was advised Release of Information must be  obtained prior to any record release in order to collaborate their care with an outside provider. Patient/Guardian was advised if they have not already done so to contact the registration department to sign all necessary forms in order for [place] to release information regarding their care.   Consent: Patient/Guardian gives verbal consent for treatment and assignment  of benefits for services provided during this visit. Patient/Guardian expressed understanding and agreed to proceed.    Norman Clay, MD 09/19/2022, 9:27 AM

## 2022-09-19 ENCOUNTER — Encounter: Payer: Self-pay | Admitting: Psychiatry

## 2022-09-19 ENCOUNTER — Telehealth (INDEPENDENT_AMBULATORY_CARE_PROVIDER_SITE_OTHER): Payer: 59 | Admitting: Psychiatry

## 2022-09-19 DIAGNOSIS — R632 Polyphagia: Secondary | ICD-10-CM

## 2022-09-19 DIAGNOSIS — G4700 Insomnia, unspecified: Secondary | ICD-10-CM | POA: Diagnosis not present

## 2022-09-19 DIAGNOSIS — F3341 Major depressive disorder, recurrent, in partial remission: Secondary | ICD-10-CM | POA: Diagnosis not present

## 2022-09-19 DIAGNOSIS — F603 Borderline personality disorder: Secondary | ICD-10-CM | POA: Diagnosis not present

## 2022-09-19 MED ORDER — TOPIRAMATE 25 MG PO TABS: 25.0000 mg | ORAL_TABLET | Freq: Two times a day (BID) | ORAL | 1 refills | Status: DC

## 2022-09-19 NOTE — Patient Instructions (Signed)
Continue lexapro 20 mg daily  Continue bupropion 450 mg daily Continue Abilify 20 mg daily Increase topiramate 25 mg twice a day Continue trazodone 25 -50 mg at night as needed  Referred for sleep evaluation Next appointment: 4/24 at 11:30

## 2022-10-23 NOTE — Progress Notes (Deleted)
BH MD/PA/NP OP Progress Note  10/23/2022 5:02 AM Troy Mahoney  MRN:  [id number]  Chief Complaint: No chief complaint on file.  HPI: *** Visit Diagnosis: No diagnosis found.  Past Psychiatric History: Please see initial evaluation for full details. I have reviewed the history. No updates at this time.     Past Medical History: No past medical history on file. No past surgical history on file.  Family Psychiatric History: Please see initial evaluation for full details. I have reviewed the history. No updates at this time.     Family History:  Family History  Problem Relation Age of Onset   Anxiety disorder Mother    Depression Mother    Anxiety disorder Maternal Aunt    Anxiety disorder Maternal Grandmother     Social History:  Social History   Socioeconomic History   Marital status: Single    Spouse name: Not on file   Number of children: Not on file   Years of education: Not on file   Highest education level: Not on file  Occupational History   Not on file  Tobacco Use   Smoking status: Never   Smokeless tobacco: Never  Substance and Sexual Activity   Alcohol use: Yes    Comment: occ.   Drug use: No   Sexual activity: Not on file  Other Topics Concern   Not on file  Social History Narrative   Not on file   Social Determinants of Health   Financial Resource Strain: Not on file  Food Insecurity: Not on file  Transportation Needs: Not on file  Physical Activity: Not on file  Stress: Not on file  Social Connections: Not on file    Allergies: No Known Allergies  Metabolic Disorder Labs: No results found for: "HGBA1C", "MPG" No results found for: "PROLACTIN" No results found for: "CHOL", "TRIG", "HDL", "CHOLHDL", "VLDL", "LDLCALC" Lab Results  Component Value Date   TSH 0.69 08/28/2018    Therapeutic Level Labs: No results found for: "LITHIUM" No results found for: "VALPROATE" No results found for: "CBMZ"  Current Medications: Current  Outpatient Medications  Medication Sig Dispense Refill   amLODipine (NORVASC) 5 MG tablet TAKE 1 TABLET BY MOUTH DAILY WITH OLMERSARTAN     ARIPiprazole (ABILIFY) 20 MG tablet Take 1 tablet (20 mg total) by mouth daily. 90 tablet 0   buPROPion (WELLBUTRIN XL) 150 MG 24 hr tablet Take 3 tablets (450 mg total) by mouth daily. 270 tablet 1   escitalopram (LEXAPRO) 20 MG tablet Take 1 tablet (20 mg total) by mouth daily. 90 tablet 1   hydrochlorothiazide (MICROZIDE) 12.5 MG capsule Take 12.5 mg by mouth daily.     ibuprofen (ADVIL,MOTRIN) 200 MG tablet Take 200 mg by mouth every 6 (six) hours as needed for mild pain or moderate pain.     olmesartan (BENICAR) 20 MG tablet TAKE 1 TABLET BY MOUTH DAILY FOR BLOOD PRESSURE WITH AMLODIPINE     topiramate (TOPAMAX) 25 MG tablet Take 1 tablet (25 mg total) by mouth 2 (two) times daily. 60 tablet 1   traZODone (DESYREL) 50 MG tablet Take 1 tablet (50 mg total) by mouth at bedtime as needed for sleep. 90 tablet 0   No current facility-administered medications for this visit.     Musculoskeletal: Strength & Muscle Tone:  N/A Gait & Station:  N/A Patient leans: N/A  Psychiatric Specialty Exam: Review of Systems  There were no vitals taken for this visit.There is no height or weight  on file to calculate BMI.  General Appearance: {Appearance:22683}  Eye Contact:  {BHH EYE CONTACT:22684}  Speech:  Clear and Coherent  Volume:  Normal  Mood:  {BHH MOOD:22306}  Affect:  {Affect (PAA):22687}  Thought Process:  Coherent  Orientation:  Full (Time, Place, and Person)  Thought Content: Logical   Suicidal Thoughts:  {ST/HT (PAA):22692}  Homicidal Thoughts:  {ST/HT (PAA):22692}  Memory:  Immediate;   Good  Judgement:  {Judgement (PAA):22694}  Insight:  {Insight (PAA):22695}  Psychomotor Activity:  Normal  Concentration:  Concentration: Good and Attention Span: Good  Recall:  Good  Fund of Knowledge: Good  Language: Good  Akathisia:  No  Handed:  Right   AIMS (if indicated): not done  Assets:  Communication Skills Desire for Improvement  ADL's:  Intact  Cognition: WNL  Sleep:  {B[facility] GOOD/FAIR/POOR:22877}   Screenings: GAD-7    Flowsheet Row Office Visit from confidential encounter on 03/29/2022 Counselor from 08/23/2021 in [facility] at [place]  Total GAD-7 Score 12 16      PHQ2-9    Flowsheet Row Office Visit from confidential encounter on 03/29/2022 Counselor from 08/23/2021 in [facility] at [place] Video Visit from confidential encounter on 07/14/2021 Video Visit from confidential encounter on 03/10/2021 Video Visit from confidential encounter on 11/03/2020  PHQ-2 Total Score PHQ-9 Total Score Flowsheet Row Counselor from 08/23/2021 in [facility] at [place] Video Visit from confidential encounter on 07/14/2021 Video Visit from confidential encounter on 03/10/2021  C-SSRS RISK CATEGORY Error: Q3, 4, or 5 should not be populated when Q2 is No Error: Q3, 4, or 5 should not be populated when Q2 is No Error: Q3, 4, or 5 should not be populated when Q2 is No        Assessment and Plan:  Troy Mahoney is a 32 y.o. year old male with a history of depression, hypertension, who presents for follow up appointment for below.    1. MDD (major depressive disorder), recurrent, in partial remission ([facility]) 2. Borderline personality disorder ([facility]) # r/o with seasonal pattern Acute stressors include:unemployment, applying for jobs Other stressors include:financial strain, lack of transportation,  loss of his father, his sister     History:   There has been overall improvement in depressive symptoms since the last visit.  Will continue Lexapro, bupropion and Abilify to target depression.  Coached behavioral activation.    3. Binge eating Significant improvement since starting topiramate.  Will do further uptitration to  target binge eating, and weight gain associated with antipsychotic use.  Discussed risk of drowsiness.    4. Insomnia, unspecified type Improving.  Will continue trazodone as needed for insomnia.  Noted that although he was recommended for sleep evaluation, it is on hold now due to lack of transportation.    Plan  Continue lexapro 20 mg daily  Continue bupropion 450 mg daily Continue Abilify 20 mg daily Increase topiramate 25 mg twice a day Continue trazodone 25 -50 mg at night as needed  Referred for sleep evaluation Next appointment: 4/24 at 11:30 for 30 mins, video [email or web address]    Past trials of medication: sertraline (insomnia) , bupropion, lamotrigine (weight gain)              The patient demonstrates the following risk factors for suicide: Chronic risk factors for suicide include: psychiatric disorder of depression. Acute risk  factors for suicide include: loss (financial, interpersonal, professional). Protective factors for this patient include: responsibility to others (children, family), coping skills and hope for the future. Considering these factors, the overall suicide risk at this point appears to be moderate, but not at imminent risk. He denies gun access at home. Patient is appropriate for outpatient follow up.  Collaboration of Care: Collaboration of Care: {BH OP Collaboration of Care:21014065}  Patient/Guardian was advised Release of Information must be obtained prior to any record release in order to collaborate their care with an outside provider. Patient/Guardian was advised if they have not already done so to contact the registration department to sign all necessary forms in order for [place] to release information regarding their care.   Consent: Patient/Guardian gives verbal consent for treatment and assignment of benefits for services provided during this visit. Patient/Guardian expressed understanding and agreed to proceed.    [name], MD 10/23/2022, 5:02  AM

## 2022-10-26 ENCOUNTER — Telehealth: Payer: 59 | Admitting: Psychiatry

## 2022-12-12 ENCOUNTER — Other Ambulatory Visit: Payer: Self-pay | Admitting: Psychiatry

## 2023-02-08 NOTE — Telephone Encounter (Signed)
Message left to call and schedule.

## 2023-02-08 NOTE — Telephone Encounter (Signed)
The refill has been ordered as requested. Please contact the patient to schedule a follow-up visit.

## 2023-03-13 ENCOUNTER — Encounter: Payer: Self-pay | Admitting: Psychiatry

## 2023-03-13 ENCOUNTER — Telehealth (INDEPENDENT_AMBULATORY_CARE_PROVIDER_SITE_OTHER): Payer: Medicaid Other | Admitting: Psychiatry

## 2023-03-13 DIAGNOSIS — R632 Polyphagia: Secondary | ICD-10-CM | POA: Diagnosis not present

## 2023-03-13 DIAGNOSIS — G4700 Insomnia, unspecified: Secondary | ICD-10-CM

## 2023-03-13 DIAGNOSIS — F331 Major depressive disorder, recurrent, moderate: Secondary | ICD-10-CM

## 2023-03-13 DIAGNOSIS — F603 Borderline personality disorder: Secondary | ICD-10-CM | POA: Diagnosis not present

## 2023-03-13 MED ORDER — TOPIRAMATE 25 MG PO TABS: 25.0000 mg | ORAL_TABLET | Freq: Every day | ORAL | 1 refills | Status: DC

## 2023-03-13 MED ORDER — BUPROPION HCL ER (XL) 150 MG PO TB24: 450.0000 mg | ORAL_TABLET | Freq: Every day | ORAL | 3 refills | Status: DC

## 2023-03-13 MED ORDER — ESCITALOPRAM OXALATE 20 MG PO TABS: 20.0000 mg | ORAL_TABLET | Freq: Every day | ORAL | 3 refills | Status: DC

## 2023-03-13 MED ORDER — TRAZODONE HCL 50 MG PO TABS: 50.0000 mg | ORAL_TABLET | Freq: Every evening | ORAL | 3 refills | Status: DC | PRN

## 2023-03-13 MED ORDER — ARIPIPRAZOLE 20 MG PO TABS: 20.0000 mg | ORAL_TABLET | Freq: Every day | ORAL | 1 refills | Status: DC
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
## 2023-03-13 NOTE — Patient Instructions (Signed)
Continue lexapro 20 mg daily  Continue bupropion 450 mg daily Continue Abilify 20 mg daily Continue topiramate 25 mg at night Continue trazodone 25 -50 mg at night as needed  Referral for sleep evaluation Next appointment: 10/23 at 11 am

## 2023-04-20 NOTE — Progress Notes (Deleted)
Sent a video visit link through Epic, but the patient didn't sign in. Tried calling for today's appointment, but got no answer. Left a voicemail instructing the patient to contact the office at [phone].  

## 2023-04-26 ENCOUNTER — Telehealth: Payer: Medicaid Other | Admitting: Psychiatry

## 2023-05-15 ENCOUNTER — Telehealth: Payer: Self-pay | Admitting: Psychiatry

## 2023-05-15 NOTE — Telephone Encounter (Signed)
Noted, thanks!

## 2023-05-15 NOTE — Telephone Encounter (Signed)
Patient has scheduled virtual appt for 05/17/23. He has faxed paperwork to be filled out for accommodations at work. Advised him he may have to sign an ROI and I will email once the forms are discussed at appointment. Forms are in box

## 2023-05-15 NOTE — Progress Notes (Unsigned)
Virtual Visit via Video Note  I connected with Troy Mahoney on 05/17/23 at  4:00 PM EST by a video enabled telemedicine application and verified that I am speaking with the correct person using two identifiers.  Location: Patient: home Provider: office Persons participated in the visit- patient, provider    I discussed the limitations of evaluation and management by telemedicine and the availability of in person appointments. The patient expressed understanding and agreed to proceed.  I discussed the assessment and treatment plan with the patient. The patient was provided an opportunity to ask questions and all were answered. The patient agreed with the plan and demonstrated an understanding of the instructions.   The patient was advised to call back or seek an in-person evaluation if the symptoms worsen or if the condition fails to improve as anticipated.  I provided 30 minutes of non-face-to-face time during this encounter.   [name], MD    [facility] MD/PA/NP OP Progress Note  05/17/2023 4:26 PM Troy Mahoney  MRN:  [id number]  Chief Complaint:  Chief Complaint  Patient presents with   Follow-up   HPI:  This is a follow-up appointment for depression, borderline personality disorder, insomnia.  He states that it has been rocky.  The positive note is that he has been working out more.  He was very stressed with the election, and had suicidal thoughts.  He denies acting on it, and denies any intent or plan.  He thinks working out has been helpful.  He is actively looking for another job.  He did not recognize that the current job requires high-volume of workload.  He talks about his mother, who had the surgery.  She will be retiring.  He is planning to get a car in April so that he can have his own vehicle.  He sleeps up to 5 hours.  He feels depressed at times.  He has fair appetite, denies binge eating.  He denies SI.  He believes the current combination of medication has been  working good, and he feels comfortable to stay on the current medication regimen.   215 lbs Wt Readings from Last 3 Encounters:  03/29/22 271 lb (122.9 kg)  09/25/18 248 lb (112.5 kg)  08/28/18 251 lb (113.9 kg)     Visit Diagnosis:    ICD-10-CM   1. MDD (major depressive disorder), recurrent episode, moderate (HCC)  F33.1     2. Borderline personality disorder (HCC)  F60.3     3. Binge eating  R63.2     4. Insomnia, unspecified type  G47.00       Past Psychiatric History: Please see initial evaluation for full details. I have reviewed the history. No updates at this time.     Past Medical History: No past medical history on file. No past surgical history on file.  Family Psychiatric History: Please see initial evaluation for full details. I have reviewed the history. No updates at this time.    Family History:  Family History  Problem Relation Age of Onset   Anxiety disorder Mother    Depression Mother    Anxiety disorder Maternal Aunt    Anxiety disorder Maternal Grandmother     Social History:  Social History   Socioeconomic History   Marital status: Single    Spouse name: Not on file   Number of children: Not on file   Years of education: Not on file   Highest education level: Not on file  Occupational History  Not on file  Tobacco Use   Smoking status: Never   Smokeless tobacco: Never  Substance and Sexual Activity   Alcohol use: Yes    Comment: occ.   Drug use: No   Sexual activity: Not on file  Other Topics Concern   Not on file  Social History Narrative   Not on file   Social Determinants of Health   Financial Resource Strain: Not on file  Food Insecurity: Not on file  Transportation Needs: Not on file  Physical Activity: Not on file  Stress: Not on file  Social Connections: Not on file    Allergies: No Known Allergies  Metabolic Disorder Labs: No results found for: "HGBA1C", "MPG" No results found for: "PROLACTIN" No results found  for: "CHOL", "TRIG", "HDL", "CHOLHDL", "VLDL", "LDLCALC" Lab Results  Component Value Date   TSH 0.69 08/28/2018    Therapeutic Level Labs: No results found for: "LITHIUM" No results found for: "VALPROATE" No results found for: "CBMZ"  Current Medications: Current Outpatient Medications  Medication Sig Dispense Refill   amLODipine (NORVASC) 5 MG tablet TAKE 1 TABLET BY MOUTH DAILY WITH OLMERSARTAN     ARIPiprazole (ABILIFY) 20 MG tablet Take 1 tablet (20 mg total) by mouth daily. 30 tablet 1   buPROPion (WELLBUTRIN XL) 150 MG 24 hr tablet Take 3 tablets (450 mg total) by mouth daily. 90 tablet 3   escitalopram (LEXAPRO) 20 MG tablet Take 1 tablet (20 mg total) by mouth daily. 30 tablet 3   hydrochlorothiazide (MICROZIDE) 12.5 MG capsule Take 12.5 mg by mouth daily.     ibuprofen (ADVIL,MOTRIN) 200 MG tablet Take 200 mg by mouth every 6 (six) hours as needed for mild pain or moderate pain.     olmesartan (BENICAR) 20 MG tablet TAKE 1 TABLET BY MOUTH DAILY FOR BLOOD PRESSURE WITH AMLODIPINE     topiramate (TOPAMAX) 25 MG tablet Take 1 tablet (25 mg total) by mouth at bedtime. 30 tablet 1   traZODone (DESYREL) 50 MG tablet Take 1 tablet (50 mg total) by mouth at bedtime as needed for sleep. 30 tablet 3   No current facility-administered medications for this visit.     Musculoskeletal: Strength & Muscle Tone:  N/A Gait & Station:  N/A Patient leans: N/A  Psychiatric Specialty Exam: Review of Systems  Psychiatric/Behavioral:  Positive for dysphoric mood, sleep disturbance and suicidal ideas. Negative for agitation, behavioral problems, confusion, decreased concentration, hallucinations and self-injury. The patient is nervous/anxious. The patient is not hyperactive.   All other systems reviewed and are negative.   There were no vitals taken for this visit.There is no height or weight on file to calculate BMI.  General Appearance: Well Groomed  Eye Contact:  Good  Speech:  Clear and  Coherent  Volume:  Normal  Mood:   good and bad  Affect:  Appropriate, Congruent, and calm  Thought Process:  Coherent  Orientation:  Full (Time, Place, and Person)  Thought Content: Logical   Suicidal Thoughts:  No  Homicidal Thoughts:  No  Memory:  Immediate;   Good  Judgement:  Good  Insight:  Good  Psychomotor Activity:  Normal  Concentration:  Concentration: Good and Attention Span: Good  Recall:  Good  Fund of Knowledge: Good  Language: Good  Akathisia:  No  Handed:  Right  AIMS (if indicated): not done  Assets:  Communication Skills Desire for Improvement  ADL's:  Intact  Cognition: WNL  Sleep:  Fair   Screenings: GAD-7  Flowsheet Row Office Visit from confidential encounter on 03/29/2022 Counselor from 08/23/2021 in [facility] at [place]  Total GAD-7 Score 12 16      PHQ2-9    Flowsheet Row Office Visit from confidential encounter on 03/29/2022 Counselor from 08/23/2021 in [facility] [place] Video Visit from confidential encounter on 07/14/2021 Video Visit from confidential encounter on 03/10/2021 Video Visit from confidential encounter on 11/03/2020  PHQ-2 Total Score 4 3 4 2 2   PHQ-9 Total Score 11 10 19 11 11       Flowsheet Row Counselor from 08/23/2021 in [facility] [place] Video Visit from confidential encounter on 07/14/2021 Video Visit from confidential encounter on 03/10/2021  C-SSRS RISK CATEGORY Error: Q3, 4, or 5 should not be populated when Q2 is No Error: Q3, 4, or 5 should not be populated when Q2 is No Error: Q3, 4, or 5 should not be populated when Q2 is No        Assessment and Plan:  Troy Mahoney is a 32 y.o. year old male with a history of depression, hypertension, who presents for follow up appointment for below.    1. MDD (major depressive disorder), recurrent episode, moderate (HCC) 2. Borderline personality disorder (HCC) # r/o with  seasonal pattern Acute stressors include:unemployment,  Other stressors include:financial strain, lack of transportation,  loss of his father, his sister     History:   Although he reports recent worsening in mood symptoms in the context of election, his mood has been overall improving since the last visit.  Will continue Lexapro, bupropion and Abilify to target depression.  He has some pattern of mood instability, but this is likely secondary to ineffective coping skills.   He was advised again to reconnect with Mr. Montez Morita for therapy.   3. Binge eating It has been overall improving.  Will continue current dose of topiramate to target binge eating, and weight gain associated with antipsychotic use.   4. Insomnia, unspecified type Overall stable.  Will continue trazodone as needed for insomnia.  He will be reaching out to sleep specialist given his history of snoring, fatigue and middle insomnia.    Plan  Continue lexapro 20 mg daily  Continue bupropion 450 mg daily Continue Abilify 20 mg daily Continue topiramate 25 mg at night Continue trazodone 25 -50 mg at night as needed  Referred for sleep evaluation Next appointment: 1/8 at 4 pm for 30 mins, video [email or web address]  Emergency resources which includes 911, ED, suicide crisis line (988) are discussed.      Past trials of medication: sertraline (insomnia) , bupropion, lamotrigine (weight gain)   I support providing him with an accommodation to take one day off per month, up to three episodes per month, for flare-ups of symptoms. He is unable to work at full capacity when his mood symptoms worsen.             The patient demonstrates the following risk factors for suicide: Chronic risk factors for suicide include: psychiatric disorder of depression. Acute risk factors for suicide include: loss (financial, interpersonal, professional). Protective factors for this patient include: responsibility to others (children, family), coping  skills and hope for the future. Considering these factors, the overall suicide risk at this point appears to be low. He denies gun access at home. Patient is appropriate for outpatient follow up.   Collaboration of Care: Collaboration of Care: Other reviewed notes in Epic  Patient/Guardian was  advised Release of Information must be obtained prior to any record release in order to collaborate their care with an outside provider. Patient/Guardian was advised if they have not already done so to contact the registration department to sign all necessary forms in order for [place] to release information regarding their care.   Consent: Patient/Guardian gives verbal consent for treatment and assignment of benefits for services provided during this visit. Patient/Guardian expressed understanding and agreed to proceed.    [name], MD 05/17/2023, 4:26 PM

## 2023-05-17 ENCOUNTER — Encounter: Payer: Self-pay | Admitting: Psychiatry

## 2023-05-17 ENCOUNTER — Telehealth (INDEPENDENT_AMBULATORY_CARE_PROVIDER_SITE_OTHER): Payer: Medicaid Other | Admitting: Psychiatry

## 2023-05-17 DIAGNOSIS — F331 Major depressive disorder, recurrent, moderate: Secondary | ICD-10-CM | POA: Diagnosis not present

## 2023-05-17 DIAGNOSIS — F603 Borderline personality disorder: Secondary | ICD-10-CM | POA: Diagnosis not present

## 2023-05-17 DIAGNOSIS — R632 Polyphagia: Secondary | ICD-10-CM | POA: Diagnosis not present

## 2023-05-17 DIAGNOSIS — G4700 Insomnia, unspecified: Secondary | ICD-10-CM | POA: Diagnosis not present

## 2023-05-17 MED ORDER — TOPIRAMATE 25 MG PO TABS: 25.0000 mg | ORAL_TABLET | Freq: Every day | ORAL | 1 refills | Status: DC

## 2023-05-17 MED ORDER — ARIPIPRAZOLE 20 MG PO TABS: 20.0000 mg | ORAL_TABLET | Freq: Every day | ORAL | 1 refills | Status: DC

## 2023-05-17 NOTE — Patient Instructions (Signed)
Continue lexapro 20 mg daily  Continue bupropion 450 mg daily Continue Abilify 20 mg daily Continue topiramate 25 mg at night Continue trazodone 25 -50 mg at night as needed  Referred for sleep evaluation Next appointment: 1/8 at 4 pm

## 2023-07-09 NOTE — Progress Notes (Signed)
 Virtual Visit via Video Note  I connected with Troy Mahoney on 07/12/23 at  4:00 PM EST by a video enabled telemedicine application and verified that I am speaking with the correct person using two identifiers.  Location: Patient: home Provider: office Persons participated in the visit- patient, provider    I discussed the limitations of evaluation and management by telemedicine and the availability of in person appointments. The patient expressed understanding and agreed to proceed.    I discussed the assessment and treatment plan with the patient. The patient was provided an opportunity to ask questions and all were answered. The patient agreed with the plan and demonstrated an understanding of the instructions.   The patient was advised to call back or seek an in-person evaluation if the symptoms worsen or if the condition fails to improve as anticipated.  I provided 20 minutes of non-face-to-face time during this encounter.   [name], MD     [facility] MD/PA/NP OP Progress Note  07/12/2023 4:28 PM Troy Mahoney  MRN:  [id number]  Chief Complaint:  Chief Complaint  Patient presents with   Follow-up   HPI:  This is a follow-up appointment for depression, PTSD and insomnia.  He states that holiday was a little iffy.  He was thinking more about his father on Thanksgiving, although he was nervous and Christmas.  He was feeling very down when he thought about his father.  He also did not feel well when he spends time by himself as his family were out of town for trip while he had to work.  He could not get a job he was referring to at the previous visit.  He has been trying to be positive, and has been applying to jobs.  He has states he feels irritable.  He feels this way especially when he talks with people who are entitled.  He does not want to be bothered and does not want any stress.  He continues to work out, and it helps him to feel confident and release energy.  He sleeps  4 to 5 hours.  He denies SI except around Thanksgiving.  He has been running out topiramate  as he has been taking twice a day.  He notices worsening in binge eating when he runs out of this medication.  He agrees with the plan as outlined below.    Wt Readings from Last 3 Encounters:  03/29/22 271 lb (122.9 kg)  09/25/18 248 lb (112.5 kg)  08/28/18 251 lb (113.9 kg)     Visit Diagnosis:    ICD-10-CM   1. MDD (major depressive disorder), recurrent episode, mild (HCC)  F33.0     2. Borderline personality disorder (HCC)  F60.3     3. Binge eating  R63.2     4. Insomnia, unspecified type  G47.00       Past Psychiatric History: Please see initial evaluation for full details. I have reviewed the history. No updates at this time.     Past Medical History: No past medical history on file. No past surgical history on file.  Family Psychiatric History: Please see initial evaluation for full details. I have reviewed the history. No updates at this time.     Family History:  Family History  Problem Relation Age of Onset   Anxiety disorder Mother    Depression Mother    Anxiety disorder Maternal Aunt    Anxiety disorder Maternal Grandmother     Social History:  Social History  Socioeconomic History   Marital status: Single    Spouse name: Not on file   Number of children: Not on file   Years of education: Not on file   Highest education level: Not on file  Occupational History   Not on file  Tobacco Use   Smoking status: Never   Smokeless tobacco: Never  Substance and Sexual Activity   Alcohol use: Yes    Comment: occ.   Drug use: No   Sexual activity: Not on file  Other Topics Concern   Not on file  Social History Narrative   Not on file   Social Drivers of Health   Financial Resource Strain: Not on file  Food Insecurity: Not on file  Transportation Needs: Not on file  Physical Activity: Not on file  Stress: Not on file  Social Connections: Not on file     Allergies: No Known Allergies  Metabolic Disorder Labs: No results found for: HGBA1C, MPG No results found for: PROLACTIN No results found for: CHOL, TRIG, HDL, CHOLHDL, VLDL, LDLCALC Lab Results  Component Value Date   TSH 0.69 08/28/2018    Therapeutic Level Labs: No results found for: LITHIUM No results found for: VALPROATE No results found for: CBMZ  Current Medications: Current Outpatient Medications  Medication Sig Dispense Refill   amLODipine (NORVASC) 5 MG tablet TAKE 1 TABLET BY MOUTH DAILY WITH OLMERSARTAN     [START ON 07/16/2023] ARIPiprazole  (ABILIFY ) 20 MG tablet Take 1 tablet (20 mg total) by mouth daily. 30 tablet 3   buPROPion  (WELLBUTRIN  XL) 150 MG 24 hr tablet Take 3 tablets (450 mg total) by mouth daily. 90 tablet 5   escitalopram  (LEXAPRO ) 20 MG tablet Take 1 tablet (20 mg total) by mouth daily. 30 tablet 5   hydrochlorothiazide (MICROZIDE) 12.5 MG capsule Take 12.5 mg by mouth daily.     ibuprofen (ADVIL,MOTRIN) 200 MG tablet Take 200 mg by mouth every 6 (six) hours as needed for mild pain or moderate pain.     olmesartan (BENICAR) 20 MG tablet TAKE 1 TABLET BY MOUTH DAILY FOR BLOOD PRESSURE WITH AMLODIPINE     topiramate  (TOPAMAX ) 25 MG tablet Take 1 tablet (25 mg total) by mouth 2 (two) times daily. 60 tablet 1   traZODone  (DESYREL ) 50 MG tablet Take 1 tablet (50 mg total) by mouth at bedtime as needed for sleep. 30 tablet 5   No current facility-administered medications for this visit.     Musculoskeletal: Strength & Muscle Tone:  N/A Gait & Station:  N/A Patient leans: N/A  Psychiatric Specialty Exam: Review of Systems  Psychiatric/Behavioral:  Positive for dysphoric mood and sleep disturbance. Negative for agitation, behavioral problems, confusion, decreased concentration, hallucinations, self-injury and suicidal ideas. The patient is nervous/anxious. The patient is not hyperactive.   All other systems reviewed and are  negative.   There were no vitals taken for this visit.There is no height or weight on file to calculate BMI.  General Appearance:  well groomed  Eye Contact:  Good  Speech:  Clear and Coherent  Volume:  Normal  Mood:   fine  Affect:  Appropriate, Congruent, and calm  Thought Process:  Coherent  Orientation:  Full (Time, Place, and Person)  Thought Content: Logical   Suicidal Thoughts:  No  Homicidal Thoughts:  No  Memory:  Immediate;   Good  Judgement:  Good  Insight:  Good  Psychomotor Activity:  Normal  Concentration:  Concentration: Good and Attention Span: Good  Recall:  Good  Fund of Knowledge: Good  Language: Good  Akathisia:  No  Handed:  Right  AIMS (if indicated): not done  Assets:  Communication Skills Desire for Improvement  ADL's:  Intact  Cognition: WNL  Sleep:  Fair   Screenings: GAD-7    Flowsheet Row Office Visit from confidential encounter on 03/29/2022 Counselor from 08/23/2021 in [facility] at [place]  Total GAD-7 Score 12 16      PHQ2-9    Flowsheet Row Office Visit from confidential encounter on 03/29/2022 Counselor from 08/23/2021 in [facility] at [place] Video Visit from confidential encounter on 07/14/2021 Video Visit from confidential encounter on 03/10/2021 Video Visit from confidential encounter on 11/03/2020  PHQ-2 Total Score 4 3 4 2 2   PHQ-9 Total Score 11 10 19 11 11       Flowsheet Row Counselor from 08/23/2021 in [facility] at [place] Video Visit from confidential encounter on 07/14/2021 Video Visit from confidential encounter on 03/10/2021  C-SSRS RISK CATEGORY Error: Q3, 4, or 5 should not be populated when Q2 is No Error: Q3, 4, or 5 should not be populated when Q2 is No Error: Q3, 4, or 5 should not be populated when Q2 is No        Assessment and Plan:  Troy Mahoney is a 33 y.o. year old male with a history of depression, hypertension,  who presents for follow up appointment for below.   1. MDD (major depressive disorder), recurrent episode, mild (HCC) 2. Borderline personality disorder (HCC) # r/o with seasonal pattern Acute stressors include:unemployment,  Other stressors include:financial strain, lack of transportation,  loss of his father, his sister     History:   Exam is notable for calmer and brighter affect, although he has occasional episodes of depression and irritability since the last visit. He has maintained a regular exercise routine and has been actively applying for jobs.  Will continue current medication regimen while focusing on therapy to help him develop better skills to address his mood dysregulation.  Will continue Lexapro , bupropion  and Abilify  to target depression.   3. Binge eating He reports benefit from twice a day dosing, and has worsening in binge eating when he runs out this medication.  Will continue current dose to target binge eating, and weight gain associated with antipsychotic use.   4. Insomnia, unspecified type Overall stable.  Will continue trazodone  as needed for insomnia.  He is trying to contact the sleep specialist for sleep evaluation given snoring, fatigue and middle insomnia.    Plan  Continue lexapro  20 mg daily  Continue bupropion  450 mg daily Continue Abilify  20 mg daily  EKG HR 91, qtc 408 msec, he will have PCP visit to get metabolic panels checked Continue topiramate  25 mg twice a day Continue trazodone  25 -50 mg at night as needed  Referred for sleep evaluation Next appointment: 2/25 at 4 pm for 30 mins, IP [email or web address]  Emergency resources which includes 911, ED, suicide crisis line (988) are discussed.      Past trials of medication: sertraline  (insomnia) , bupropion , lamotrigine  (weight gain)   I support providing him with an accommodation to take one day off per month, up to three episodes per month, for flare-ups of symptoms. He is unable to work at full  capacity when his mood symptoms worsen.             The patient demonstrates the following risk factors for  suicide: Chronic risk factors for suicide include: psychiatric disorder of depression. Acute risk factors for suicide include: loss (financial, interpersonal, professional). Protective factors for this patient include: responsibility to others (children, family), coping skills and hope for the future. Considering these factors, the overall suicide risk at this point appears to be low. He denies gun access at home. Patient is appropriate for outpatient follow up.    Collaboration of Care: Collaboration of Care: Other reviewed notes in Epic  Patient/Guardian was advised Release of Information must be obtained prior to any record release in order to collaborate their care with an outside provider. Patient/Guardian was advised if they have not already done so to contact the registration department to sign all necessary forms in order for us  to release information regarding their care.   Consent: Patient/Guardian gives verbal consent for treatment and assignment of benefits for services provided during this visit. Patient/Guardian expressed understanding and agreed to proceed.    [name], MD 07/12/2023, 4:28 PM

## 2023-07-12 ENCOUNTER — Encounter: Payer: Self-pay | Admitting: Psychiatry

## 2023-07-12 ENCOUNTER — Telehealth (INDEPENDENT_AMBULATORY_CARE_PROVIDER_SITE_OTHER): Payer: Medicaid Other | Admitting: Psychiatry

## 2023-07-12 DIAGNOSIS — F603 Borderline personality disorder: Secondary | ICD-10-CM | POA: Diagnosis not present

## 2023-07-12 DIAGNOSIS — R632 Polyphagia: Secondary | ICD-10-CM | POA: Diagnosis not present

## 2023-07-12 DIAGNOSIS — F330 Major depressive disorder, recurrent, mild: Secondary | ICD-10-CM

## 2023-07-12 DIAGNOSIS — G4700 Insomnia, unspecified: Secondary | ICD-10-CM | POA: Diagnosis not present

## 2023-07-12 MED ORDER — TRAZODONE HCL 50 MG PO TABS: 50.0000 mg | ORAL_TABLET | Freq: Every evening | ORAL | 5 refills | Status: DC | PRN

## 2023-07-12 MED ORDER — TOPIRAMATE 25 MG PO TABS: 25.0000 mg | ORAL_TABLET | Freq: Two times a day (BID) | ORAL | 1 refills | Status: DC

## 2023-07-12 MED ORDER — ESCITALOPRAM OXALATE 20 MG PO TABS: 20.0000 mg | ORAL_TABLET | Freq: Every day | ORAL | 5 refills | Status: DC

## 2023-07-12 MED ORDER — ARIPIPRAZOLE 20 MG PO TABS: 20.0000 mg | ORAL_TABLET | Freq: Every day | ORAL | 3 refills | Status: DC

## 2023-07-12 MED ORDER — BUPROPION HCL ER (XL) 150 MG PO TB24: 450.0000 mg | ORAL_TABLET | Freq: Every day | ORAL | 5 refills | Status: DC

## 2023-07-12 NOTE — Patient Instructions (Signed)
 Continue lexapro 20 mg daily  Continue bupropion 450 mg daily Continue Abilify 20 mg daily   Continue topiramate 25 mg twice a day Continue trazodone 25 -50 mg at night as needed  Referred for sleep evaluation Next appointment: 2/25 at 4 pm

## 2023-08-26 NOTE — Progress Notes (Unsigned)
 [facility] MD/PA/NP OP Progress Note  08/29/2023 5:22 PM ETHAN CLAYBURN  MRN:  [id number]  Chief Complaint:  Chief Complaint  Patient presents with   Follow-up   HPI:  This is a follow-up appointment for depression, borderline personality disorder, insomnia.  He states that he has been doing work up to 6 days a week.  It lasts up to 4 hours at times.  He also takes [employer] pre work out, and has occasional palpitations when he does exercise.  He states that the attendance at the current job becomes a problem.  He is now in performance improvement plan.  He had to take day off when he was rejected from the job he wanted to have.  He is concerned about termination if he were to call out another time.  He has been feeling stressed with the thought that it is March as there is anniversary on March 24th. It was very consuming.  He has insomnia on days he needs to take trazodone.  He reports good benefit from topiramate for binge eating.  Although he had a fleeting thought of SI when he was rejected, it subsided after he laid down.  He agrees to contact emergency resources if any worsening.   At the end of the interview, he mentioned that his supervisor asked whether he would be applying for FMLA, which made him feel somewhat pressured. However, he believes this approach feels backward. He acknowledged that he benefits from maintaining a routine and agreed to continue going to work for now, with plans to revisit the discussion at his next appointment.  Substance use  Tobacco Alcohol Other substances/  Current   Madness pre work out (310 mg caffeine), one coffee  Past     Past Treatment        Wt Readings from Last 3 Encounters:  08/29/23 254 lb 3.2 oz (115.3 kg)  03/29/22 271 lb (122.9 kg)  09/25/18 248 lb (112.5 kg)    Visit Diagnosis:    ICD-10-CM   1. MDD (major depressive disorder), recurrent episode, mild (HCC)  F33.0     2. Borderline personality disorder (HCC)  F60.3     3. Binge eating   R63.2     4. High risk medication use  Z79.899       Past Psychiatric History: Please see initial evaluation for full details. I have reviewed the history. No updates at this time.     Past Medical History: History reviewed. No pertinent past medical history. History reviewed. No pertinent surgical history.  Family Psychiatric History: Please see initial evaluation for full details. I have reviewed the history. No updates at this time.     Family History:  Family History  Problem Relation Age of Onset   Anxiety disorder Mother    Depression Mother    Anxiety disorder Maternal Aunt    Anxiety disorder Maternal Grandmother     Social History:  Social History   Socioeconomic History   Marital status: Single    Spouse name: Not on file   Number of children: Not on file   Years of education: Not on file   Highest education level: Not on file  Occupational History   Not on file  Tobacco Use   Smoking status: Never   Smokeless tobacco: Never  Vaping Use   Vaping status: Every Day   Substances: Flavoring  Substance and Sexual Activity   Alcohol use: Not Currently    Comment: occ.   Drug use: No  Sexual activity: Not on file  Other Topics Concern   Not on file  Social History Narrative   Not on file   Social Drivers of Health   Financial Resource Strain: Not on file  Food Insecurity: Not on file  Transportation Needs: Not on file  Physical Activity: Not on file  Stress: Not on file  Social Connections: Not on file    Allergies: No Known Allergies  Metabolic Disorder Labs: No results found for: "HGBA1C", "MPG" No results found for: "PROLACTIN" No results found for: "CHOL", "TRIG", "HDL", "CHOLHDL", "VLDL", "LDLCALC" Lab Results  Component Value Date   TSH 0.69 08/28/2018    Therapeutic Level Labs: No results found for: "LITHIUM" No results found for: "VALPROATE" No results found for: "CBMZ"  Current Medications: Current Outpatient Medications   Medication Sig Dispense Refill   amLODipine (NORVASC) 5 MG tablet TAKE 1 TABLET BY MOUTH DAILY WITH OLMERSARTAN     ARIPiprazole (ABILIFY) 20 MG tablet Take 1 tablet (20 mg total) by mouth daily. 30 tablet 3   buPROPion (WELLBUTRIN XL) 150 MG 24 hr tablet Take 3 tablets (450 mg total) by mouth daily. 90 tablet 5   escitalopram (LEXAPRO) 20 MG tablet Take 1 tablet (20 mg total) by mouth daily. 30 tablet 5   hydrochlorothiazide (MICROZIDE) 12.5 MG capsule Take 12.5 mg by mouth daily.     ibuprofen (ADVIL,MOTRIN) 200 MG tablet Take 200 mg by mouth every 6 (six) hours as needed for mild pain or moderate pain.     olmesartan (BENICAR) 20 MG tablet TAKE 1 TABLET BY MOUTH DAILY FOR BLOOD PRESSURE WITH AMLODIPINE     traZODone (DESYREL) 50 MG tablet Take 1 tablet (50 mg total) by mouth at bedtime as needed for sleep. 30 tablet 5   [START ON 09/10/2023] topiramate (TOPAMAX) 25 MG tablet Take 1 tablet (25 mg total) by mouth 2 (two) times daily. 60 tablet 3   No current facility-administered medications for this visit.     Musculoskeletal: Strength & Muscle Tone: within normal limits Gait & Station: normal Patient leans: N/A  Psychiatric Specialty Exam: Review of Systems  Psychiatric/Behavioral:  Positive for decreased concentration, dysphoric mood and sleep disturbance. Negative for agitation, behavioral problems, confusion, hallucinations, self-injury and suicidal ideas. The patient is nervous/anxious. The patient is not hyperactive.   All other systems reviewed and are negative.   Blood pressure (!) 148/88, pulse 81, temperature 98.9 F (37.2 C), temperature source Temporal, height 6\' 3"  (1.905 m), weight 254 lb 3.2 oz (115.3 kg), SpO2 98%.Body mass index is 31.77 kg/m.  General Appearance: Well Groomed  Eye Contact:  Good  Speech:  Clear and Coherent  Volume:  Normal  Mood:   not good  Affect:  Appropriate, Congruent, and calm  Thought Process:  Coherent  Orientation:  Full (Time, Place,  and Person)  Thought Content: Logical   Suicidal Thoughts:  No  Homicidal Thoughts:  No  Memory:  Immediate;   Good  Judgement:  Good  Insight:  Good  Psychomotor Activity:  Normal  Concentration:  Concentration: Good and Attention Span: Good  Recall:  Good  Fund of Knowledge: Good  Language: Good  Akathisia:  No  Handed:  Right  AIMS (if indicated): not done  Assets:  Communication Skills Desire for Improvement  ADL's:  Intact  Cognition: WNL  Sleep:  Poor   Screenings: GAD-7    Flowsheet Row Office Visit from confidential encounter on 03/29/2022 Counselor from 08/23/2021 in [facility]  Total GAD-7 Score 12 16      PHQ2-9    Flowsheet Row Office Visit from confidential encounter on 03/29/2022 Counselor from 08/23/2021 in [facility] at [place] Video Visit from confidential encounter on 07/14/2021 Video Visit from confidential encounter on 03/10/2021 Video Visit from confidential encounter on 11/03/2020  PHQ-2 Total Score 4 3 4 2 2   PHQ-9 Total Score 11 10 19 11 11       Flowsheet Row Counselor from 08/23/2021 in [facility] at [place] Video Visit from confidential encounter on 07/14/2021 Video Visit from confidential encounter on 03/10/2021  C-SSRS RISK CATEGORY Error: Q3, 4, or 5 should not be populated when Q2 is No Error: Q3, 4, or 5 should not be populated when Q2 is No Error: Q3, 4, or 5 should not be populated when Q2 is No        Assessment and Plan:  MACY LINGENFELTER is a 33 y.o. year old male with a history of depression, hypertension, who presents for follow up appointment for below.   1. MDD (major depressive disorder), recurrent episode, mild (HCC) 2. Borderline personality disorder (HCC) # r/o with seasonal pattern Acute stressors include: work related stress, anniversary in March 24th Other stressors include:financial strain, lack of transportation,  loss of  his father, his sister     History:   He continues to experience depressive symptoms since her last visit in the context of work-related stress.  He has been able to maintain regular exercise routine, while applying for other job.  He agrees to continue on current medication regimen while intervening sleep as outlined below.  Will continue Lexapro, bupropion to target depression.  Will continue Abilify adjunctive treatment for depression.  He is now in the process of finding a new therapist.   # Insomnia Worsening.  Referral was made for evaluation of sleep apnea due to history of snoring, fatigue and middle insomnia.  He reports good benefit from trazodone; will continue at the current dose for now for insomnia.   3. Binge eating Overall stable.  Will continue current dose of topiramate to target binge eating, and medication-induced weight gain.   # caffeine use  He reports consuming caffeine containing beverage, and reports palpitation when he works out.  He is advised to cut down its use, especially given he is also on bupropion.   4. High risk medication use    Last checked  EKG HR 91, QTc457msec 02/2022  Lipid panels  Due- he will be checked at PCP  HbA1c  Due- he will be checked at  PCP      Plan  Continue lexapro 20 mg daily  Continue bupropion 450 mg daily Continue Abilify 20 mg daily  EKG HR 91, qtc 408 msec, he will have PCP visit to get metabolic panels checked Continue topiramate 25 mg twice a day Continue trazodone 25 -50 mg at night as needed  Referred for sleep evaluation Next appointment: 4/18 at 10:30, IP [email or web address]    Past trials of medication: sertraline (insomnia) , bupropion, lamotrigine (weight gain)   The patient demonstrates the following risk factors for suicide: Chronic risk factors for suicide include: psychiatric disorder of depression. Acute risk factors for suicide include: loss (financial, interpersonal, professional). Protective factors for  this patient include: responsibility to others (children, family), coping skills and hope for the future. Considering these factors, the overall suicide risk at this point appears to be low. He denies gun  access at home. Patient is appropriate for outpatient follow up.Emergency resources which includes 911, ED, suicide crisis line (988) are discussed.        Collaboration of Care: Collaboration of Care: Other reviewed notes in Epic  Patient/Guardian was advised Release of Information must be obtained prior to any record release in order to collaborate their care with an outside provider. Patient/Guardian was advised if they have not already done so to contact the registration department to sign all necessary forms in order for [place] to release information regarding their care.   Consent: Patient/Guardian gives verbal consent for treatment and assignment of benefits for services provided during this visit. Patient/Guardian expressed understanding and agreed to proceed.    [name], MD 08/29/2023, 5:22 PM

## 2023-08-29 ENCOUNTER — Encounter: Payer: Self-pay | Admitting: Psychiatry

## 2023-08-29 ENCOUNTER — Ambulatory Visit (INDEPENDENT_AMBULATORY_CARE_PROVIDER_SITE_OTHER): Payer: Medicaid Other | Admitting: Psychiatry

## 2023-08-29 VITALS — BP 148/88 | HR 81 | Temp 98.9°F | Ht 75.0 in | Wt 254.2 lb

## 2023-08-29 DIAGNOSIS — F330 Major depressive disorder, recurrent, mild: Secondary | ICD-10-CM | POA: Diagnosis not present

## 2023-08-29 DIAGNOSIS — F603 Borderline personality disorder: Secondary | ICD-10-CM | POA: Diagnosis not present

## 2023-08-29 DIAGNOSIS — R632 Polyphagia: Secondary | ICD-10-CM

## 2023-08-29 DIAGNOSIS — Z79899 Other long term (current) drug therapy: Secondary | ICD-10-CM | POA: Diagnosis not present

## 2023-08-29 MED ORDER — TOPIRAMATE 25 MG PO TABS: 25.0000 mg | ORAL_TABLET | Freq: Two times a day (BID) | ORAL | 3 refills | Status: DC

## 2023-08-29 NOTE — Patient Instructions (Signed)
 Continue lexapro 20 mg daily  Continue bupropion 450 mg daily Continue Abilify 20 mg daily   Continue topiramate 25 mg twice a day Continue trazodone 25 -50 mg at night as needed  Referred for sleep evaluation Next appointment: 4/18 at 10:30

## 2023-08-31 ENCOUNTER — Telehealth: Payer: Self-pay | Admitting: Psychiatry

## 2023-08-31 NOTE — Telephone Encounter (Signed)
 We received paperwork for a leave of absence. However, as discussed yesterday, he has decided not to proceed with it. Therefore, the form will not be completed, and the most recent note does not support it. Please notify the company of this.

## 2023-09-01 ENCOUNTER — Telehealth: Payer: Self-pay | Admitting: Psychiatry

## 2023-09-01 NOTE — Telephone Encounter (Signed)
 Documents to [facility] were faxed regarding PT not following through with leave of absence

## 2023-09-13 ENCOUNTER — Telehealth: Payer: Self-pay

## 2023-09-13 NOTE — Telephone Encounter (Signed)
 pt states that he changed his mind he wants to do the leave. he will have the company fax the paperwork. pt was told that dr. Vanetta Shawl would not be back in the office until next monday. he understood.

## 2023-09-18 NOTE — Telephone Encounter (Signed)
 I support him taking a leave of absence on an as-needed basis to manage his symptom flare-up, but I don't recommend long-term leave. Please   inform him so we can ensure we're on the same page.

## 2023-09-18 NOTE — Telephone Encounter (Signed)
 pt states he still needs paperwork filled out. (it is in your basket up front desk)

## 2023-09-19 NOTE — Telephone Encounter (Signed)
 Faxed and confirmed [facility] paperwork

## 2023-09-19 NOTE — Telephone Encounter (Signed)
 left message that paperwork was faxed and confirmed to [employer]. I also emailed him a copy also

## 2023-09-27 ENCOUNTER — Encounter: Payer: Self-pay | Admitting: Neurology

## 2023-09-27 ENCOUNTER — Institutional Professional Consult (permissible substitution): Admitting: Neurology

## 2023-09-28 ENCOUNTER — Telehealth: Payer: Self-pay

## 2023-09-28 NOTE — Telephone Encounter (Signed)
 Error

## 2023-09-28 NOTE — Telephone Encounter (Signed)
 pt was notified that forms had been corrected and faxed and confirmed.

## 2023-09-28 NOTE — Telephone Encounter (Signed)
 [facility] was corrected and signed off on by dr. Vanetta Shawl. forms was faxed and confirmed.

## 2023-10-11 ENCOUNTER — Other Ambulatory Visit: Payer: Self-pay | Admitting: Psychiatry

## 2023-10-11 MED ORDER — BUPROPION HCL ER (XL) 300 MG PO TB24: 300.0000 mg | ORAL_TABLET | Freq: Every day | ORAL | 5 refills | Status: DC

## 2023-10-11 MED ORDER — BUPROPION HCL ER (XL) 150 MG PO TB24: 150.0000 mg | ORAL_TABLET | Freq: Every day | ORAL | 5 refills | Status: DC

## 2023-10-14 NOTE — Progress Notes (Signed)
 Virtual Visit via Video Note  I connected with Troy Mahoney on 10/20/23 at 10:30 AM EDT by a video enabled telemedicine application and verified that I am speaking with the correct person using two identifiers.  Location: Patient: car Provider: home office Persons participated in the visit- patient, provider    I discussed the limitations of evaluation and management by telemedicine and the availability of in person appointments. The patient expressed understanding and agreed to proceed.    I discussed the assessment and treatment plan with the patient. The patient was provided an opportunity to ask questions and all were answered. The patient agreed with the plan and demonstrated an understanding of the instructions.   The patient was advised to call back or seek an in-person evaluation if the symptoms worsen or if the condition fails to improve as anticipated.   [name], MD    [facility] MD/PA/NP OP Progress Note  10/20/2023 11:04 AM Troy Mahoney  MRN:  [id number]  Chief Complaint:  Chief Complaint  Patient presents with   Follow-up   HPI:  This is a follow-up appointment for depression, binge eating and insomnia.  He states that his mother was found to have kidney failure.  It was triggering a variety of emotions, which he did not expect. He was angry as he did not see it coming.  She was experiencing vertigo and lightheadedness.  Although he had SI during those episode, he denies any lately, and denies any intent.  He has been able to maintain routine while he has been out of work.  He is proud that he has been able to do these despite the stress, and feeling overwhelmed.  His sleep has improved since he is taking less/half scoop of pre work out.  He has been able to eat less calories, and has lost weight.  He is planning to go back to see Mr. Franchot for therapy as he would need to learn skills to get through this.  Although he feels wary of returning to work next week, he is  planning to do so this time.  He denies any need for the paperwork this time.   239 lbs   Wt Readings from Last 3 Encounters:  08/29/23 254 lb 3.2 oz (115.3 kg)  03/29/22 271 lb (122.9 kg)  09/25/18 248 lb (112.5 kg)     Substance use   Tobacco Alcohol Other substances/  Current    denies Madness pre work out (310 mg caffeine), one coffee, otherwise denies  Past        Past Treatment             Visit Diagnosis:    ICD-10-CM   1. MDD (major depressive disorder), recurrent episode, mild (HCC)  F33.0     2. Borderline personality disorder (HCC)  F60.3     3. Binge eating  R63.2     4. Insomnia, unspecified type  G47.00       Past Psychiatric History: Please see initial evaluation for full details. I have reviewed the history. No updates at this time.     Past Medical History: No past medical history on file. No past surgical history on file.  Family Psychiatric History: Please see initial evaluation for full details. I have reviewed the history. No updates at this time.     Family History:  Family History  Problem Relation Age of Onset   Anxiety disorder Mother    Depression Mother    Anxiety disorder Maternal  Aunt    Anxiety disorder Maternal Grandmother     Social History:  Social History   Socioeconomic History   Marital status: Single    Spouse name: Not on file   Number of children: Not on file   Years of education: Not on file   Highest education level: Not on file  Occupational History   Not on file  Tobacco Use   Smoking status: Never   Smokeless tobacco: Never  Vaping Use   Vaping status: Every Day   Substances: Flavoring  Substance and Sexual Activity   Alcohol use: Not Currently    Comment: occ.   Drug use: No   Sexual activity: Not on file  Other Topics Concern   Not on file  Social History Narrative   Not on file   Social Drivers of Health   Financial Resource Strain: Not on file  Food Insecurity: Not on file  Transportation  Needs: Not on file  Physical Activity: Not on file  Stress: Not on file  Social Connections: Not on file    Allergies: No Known Allergies  Metabolic Disorder Labs: No results found for: HGBA1C, MPG No results found for: PROLACTIN No results found for: CHOL, TRIG, HDL, CHOLHDL, VLDL, LDLCALC Lab Results  Component Value Date   TSH 0.69 08/28/2018    Therapeutic Level Labs: No results found for: LITHIUM No results found for: VALPROATE No results found for: CBMZ  Current Medications: Current Outpatient Medications  Medication Sig Dispense Refill   amLODipine (NORVASC) 5 MG tablet TAKE 1 TABLET BY MOUTH DAILY WITH OLMERSARTAN     [START ON 11/13/2023] ARIPiprazole  (ABILIFY ) 20 MG tablet Take 1 tablet (20 mg total) by mouth daily. 30 tablet 3   buPROPion  (WELLBUTRIN  XL) 150 MG 24 hr tablet Take 1 tablet (150 mg total) by mouth daily. Take total of 450 mg daily. Take along with 300 mg tab 30 tablet 5   buPROPion  (WELLBUTRIN  XL) 300 MG 24 hr tablet Take 1 tablet (300 mg total) by mouth daily. Take total of 450 mg daily. Take along with 150 mg tab 30 tablet 5   escitalopram  (LEXAPRO ) 20 MG tablet Take 1 tablet (20 mg total) by mouth daily. 30 tablet 5   hydrochlorothiazide (MICROZIDE) 12.5 MG capsule Take 12.5 mg by mouth daily.     ibuprofen (ADVIL,MOTRIN) 200 MG tablet Take 200 mg by mouth every 6 (six) hours as needed for mild pain or moderate pain.     olmesartan (BENICAR) 20 MG tablet TAKE 1 TABLET BY MOUTH DAILY FOR BLOOD PRESSURE WITH AMLODIPINE     topiramate  (TOPAMAX ) 25 MG tablet Take 1 tablet (25 mg total) by mouth 2 (two) times daily. 60 tablet 3   traZODone  (DESYREL ) 50 MG tablet Take 1 tablet (50 mg total) by mouth at bedtime as needed for sleep. 30 tablet 5   No current facility-administered medications for this visit.     Musculoskeletal: Strength & Muscle Tone:  N/A Gait & Station:  N/A Patient leans: N/A  Psychiatric Specialty  Exam: Review of Systems  Psychiatric/Behavioral:  Positive for dysphoric mood and suicidal ideas. Negative for agitation, behavioral problems, confusion, decreased concentration, hallucinations, self-injury and sleep disturbance. The patient is not nervous/anxious and is not hyperactive.   All other systems reviewed and are negative.   There were no vitals taken for this visit.There is no height or weight on file to calculate BMI.  General Appearance: Well Groomed  Eye Contact:  Good  Speech:  Clear and Coherent  Volume:  Normal  Mood:   overwhelmed  Affect:  Appropriate, Congruent, and Full Range  Thought Process:  Coherent  Orientation:  Full (Time, Place, and Person)  Thought Content: Logical   Suicidal Thoughts:  No  Homicidal Thoughts:  No  Memory:  Immediate;   Good  Judgement:  Good  Insight:  Good  Psychomotor Activity:  Normal  Concentration:  Concentration: Good and Attention Span: Good  Recall:  Good  Fund of Knowledge: Good  Language: Good  Akathisia:  No  Handed:  Right  AIMS (if indicated): not done  Assets:  Communication Skills Desire for Improvement  ADL's:  Intact  Cognition: WNL  Sleep:  Fair   Screenings: GAD-7    Flowsheet Row Office Visit from confidential encounter on 03/29/2022 Counselor from 08/23/2021 in [facility] [place]  Total GAD-7 Score 12 16      PHQ2-9    Flowsheet Row Office Visit from confidential encounter on 03/29/2022 Counselor from 08/23/2021 in [facility] [place] Video Visit from confidential encounter on 07/14/2021 Video Visit from confidential encounter on 03/10/2021 Video Visit from confidential encounter on 11/03/2020  PHQ-2 Total Score 4 3 4 2 2   PHQ-9 Total Score 11 10 19 11 11       Flowsheet Row Counselor from 08/23/2021 in [facility] [place] Video Visit from confidential encounter on 07/14/2021 Video Visit from confidential  encounter on 03/10/2021  C-SSRS RISK CATEGORY Error: Q3, 4, or 5 should not be populated when Q2 is No Error: Q3, 4, or 5 should not be populated when Q2 is No Error: Q3, 4, or 5 should not be populated when Q2 is No        Assessment and Plan:  Troy Mahoney is a 33 y.o. year old male with a history of depression, hypertension, who presents for follow up appointment for below.   1. MDD (major depressive disorder), recurrent episode, mild (HCC) 2. Borderline personality disorder (HCC) # r/o with seasonal pattern Acute stressors include: work related stress, anniversary in March 24th Other stressors include:financial strain, lack of transportation,  loss of his father, his sister     History:   He had an episode of worsening in depression since the last visit in the context of his mother having kidney failure.  However, he has been able to stick with his daily routine, and that is planning to return to work next week.  Will continue Lexapro  to target depression along with Abilify  and bupropion  as adjunctive treatment for depression.  He is wanting to reach out to Mr. Terry for therapy.   3. Binge eating Overall improving.  Will continue current dose of topiramate  to target binge eating, and medication-induced weight gain.   4. Insomnia, unspecified type Improving since he reduced pre work out beverage.  He is planning to contact for evaluation of sleep apnea given he has history of snoring, fatigue and middle insomnia.  Will continue current dose of trazodone  as needed for insomnia.    4. High risk medication use       Last checked  EKG HR 91, QTc464msec 02/2022  Lipid panels   Due- he will be checked at PCP  HbA1c   Due- he will be checked at  PCP       Plan  Continue lexapro  20 mg daily  Continue bupropion  450 mg daily Continue Abilify  20 mg daily    Continue  topiramate  25 mg twice a day Continue trazodone  25 -50 mg at night as needed  Referred for sleep evaluation Next  appointment: 6/4 at 4 30, video  [email or web address]    Past trials of medication: sertraline  (insomnia) , bupropion , lamotrigine  (weight gain)   The patient demonstrates the following risk factors for suicide: Chronic risk factors for suicide include: psychiatric disorder of depression. Acute risk factors for suicide include: loss (financial, interpersonal, professional). Protective factors for this patient include: responsibility to others (children, family), coping skills and hope for the future. Considering these factors, the overall suicide risk at this point appears to be low. He denies gun access at home. Patient is appropriate for outpatient follow up Emergency resources which includes 911, ED, suicide crisis line (988) are discussed.      Collaboration of Care: Collaboration of Care: Other reviewed notes in Epic  Patient/Guardian was advised Release of Information must be obtained prior to any record release in order to collaborate their care with an outside provider. Patient/Guardian was advised if they have not already done so to contact the registration department to sign all necessary forms in order for us  to release information regarding their care.   Consent: Patient/Guardian gives verbal consent for treatment and assignment of benefits for services provided during this visit. Patient/Guardian expressed understanding and agreed to proceed.    [name], MD 10/20/2023, 11:04 AM

## 2023-10-20 ENCOUNTER — Telehealth (INDEPENDENT_AMBULATORY_CARE_PROVIDER_SITE_OTHER): Payer: BC Managed Care – PPO | Admitting: Psychiatry

## 2023-10-20 ENCOUNTER — Encounter: Payer: Self-pay | Admitting: Psychiatry

## 2023-10-20 DIAGNOSIS — R632 Polyphagia: Secondary | ICD-10-CM

## 2023-10-20 DIAGNOSIS — G4700 Insomnia, unspecified: Secondary | ICD-10-CM

## 2023-10-20 DIAGNOSIS — F330 Major depressive disorder, recurrent, mild: Secondary | ICD-10-CM

## 2023-10-20 DIAGNOSIS — F603 Borderline personality disorder: Secondary | ICD-10-CM | POA: Diagnosis not present

## 2023-10-20 MED ORDER — ARIPIPRAZOLE 20 MG PO TABS: 20.0000 mg | ORAL_TABLET | Freq: Every day | ORAL | 3 refills | Status: DC

## 2023-10-20 NOTE — Patient Instructions (Signed)
 Continue lexapro  20 mg daily  Continue bupropion  450 mg daily Continue Abilify  20 mg daily   Continue topiramate  25 mg twice a day Continue trazodone  25 -50 mg at night as needed  Referred for sleep evaluation Next appointment: 6/4 at 4 30,

## 2023-12-02 NOTE — Progress Notes (Unsigned)
 Virtual Visit via Video Note  I connected with Troy Mahoney on 12/06/23 at  4:30 PM EDT by a video enabled telemedicine application and verified that I am speaking with the correct person using two identifiers.  Location: Patient: car Provider: office Persons participated in the visit- patient, provider    I discussed the limitations of evaluation and management by telemedicine and the availability of in person appointments. The patient expressed understanding and agreed to proceed.    I discussed the assessment and treatment plan with the patient. The patient was provided an opportunity to ask questions and all were answered. The patient agreed with the plan and demonstrated an understanding of the instructions.   The patient was advised to call back or seek an in-person evaluation if the symptoms worsen or if the condition fails to improve as anticipated.    Todd Fossa, MD    [facility] MD/PA/NP OP Progress Note  12/06/2023 5:10 PM Troy Mahoney  MRN:  [id number]  Chief Complaint:  Chief Complaint  Patient presents with   Follow-up   HPI:  This is a follow-up appointment for depression, anxiety, binge eating.  He states that he is under significant stress, and is trying to manage it.  He returned to work once, and it was very overwhelming as he had so much information.  Although he tried going back again, and stayed there for a day as he would have in the quitter otherwise, he suffered from migraine and significant hypertension afterwards.  He could not even continue routine after he got back home.  He is trying to keep himself busy.  He feels responsible for the care of his mother, who is obsessed in losing weight.  Other people would reach out to him as she tends to be abrasive to others.  He feels like nobody seems to take his mental health into consideration.  He feels used.  This feels familiar to how he used to experience when he was a child.  He also states that his mother's  car was repossessed.  He helped her bring in her to the airport as she needed to go to [place] for this.  He states that the car issue also occurred when he was struggling in the past.  He has intrusive thoughts about the past.  He feels frustrated as he thought it would not affect him anymore.  He feels like things are out of control.  Things does not matter anymore, although he denies SI.  He has occasional insomnia.  He denies change in appetite, and denies binge eating.  He agrees with the plans as outlined below.   230 lbs Wt Readings from Last 3 Encounters:  08/29/23 254 lb 3.2 oz (115.3 kg)  03/29/22 271 lb (122.9 kg)  09/25/18 248 lb (112.5 kg)     Substance use   Tobacco Alcohol Other substances/  Current    denies Madness pre work out (310 mg caffeine), one coffee, otherwise denies  Past        Past Treatment             Visit Diagnosis:    ICD-10-CM   1. MDD (major depressive disorder), recurrent episode, moderate (HCC)  F33.1     2. Anxiety disorder, unspecified type  F41.9     3. Borderline personality disorder (HCC)  F60.3     4. Binge eating  R63.2     5. Insomnia, unspecified type  G47.00  Past Psychiatric History: Please see initial evaluation for full details. I have reviewed the history. No updates at this time.     Past Medical History: No past medical history on file. No past surgical history on file.  Family Psychiatric History: Please see initial evaluation for full details. I have reviewed the history. No updates at this time.     Family History:  Family History  Problem Relation Age of Onset   Anxiety disorder Mother    Depression Mother    Anxiety disorder Maternal Aunt    Anxiety disorder Maternal Grandmother     Social History:  Social History   Socioeconomic History   Marital status: Single    Spouse name: Not on file   Number of children: Not on file   Years of education: Not on file   Highest education level: Not on file   Occupational History   Not on file  Tobacco Use   Smoking status: Never   Smokeless tobacco: Never  Vaping Use   Vaping status: Every Day   Substances: Flavoring  Substance and Sexual Activity   Alcohol use: Not Currently    Comment: occ.   Drug use: No   Sexual activity: Not on file  Other Topics Concern   Not on file  Social History Narrative   Not on file   Social Drivers of Health   Financial Resource Strain: Not on file  Food Insecurity: Not on file  Transportation Needs: Not on file  Physical Activity: Not on file  Stress: Not on file  Social Connections: Not on file    Allergies: No Known Allergies  Metabolic Disorder Labs: No results found for: "HGBA1C", "MPG" No results found for: "PROLACTIN" No results found for: "CHOL", "TRIG", "HDL", "CHOLHDL", "VLDL", "LDLCALC" Lab Results  Component Value Date   TSH 0.69 08/28/2018    Therapeutic Level Labs: No results found for: "LITHIUM" No results found for: "VALPROATE" No results found for: "CBMZ"  Current Medications: Current Outpatient Medications  Medication Sig Dispense Refill   prazosin (MINIPRESS) 1 MG capsule Take 1 capsule (1 mg total) by mouth at bedtime for 3 days. 3 capsule 0   [START ON 12/09/2023] prazosin (MINIPRESS) 2 MG capsule Take 1 capsule (2 mg total) by mouth at bedtime. Start after completing 1 mg at night for 3 days 30 capsule 1   amLODipine (NORVASC) 5 MG tablet TAKE 1 TABLET BY MOUTH DAILY WITH OLMERSARTAN     ARIPiprazole  (ABILIFY ) 20 MG tablet Take 1 tablet (20 mg total) by mouth daily. 30 tablet 3   buPROPion  (WELLBUTRIN  XL) 150 MG 24 hr tablet Take 1 tablet (150 mg total) by mouth daily. Take total of 450 mg daily. Take along with 300 mg tab 30 tablet 5   buPROPion  (WELLBUTRIN  XL) 300 MG 24 hr tablet Take 1 tablet (300 mg total) by mouth daily. Take total of 450 mg daily. Take along with 150 mg tab 30 tablet 5   escitalopram  (LEXAPRO ) 20 MG tablet Take 1 tablet (20 mg total) by mouth  daily. 30 tablet 5   hydrochlorothiazide (MICROZIDE) 12.5 MG capsule Take 12.5 mg by mouth daily.     ibuprofen (ADVIL,MOTRIN) 200 MG tablet Take 200 mg by mouth every 6 (six) hours as needed for mild pain or moderate pain.     olmesartan (BENICAR) 20 MG tablet TAKE 1 TABLET BY MOUTH DAILY FOR BLOOD PRESSURE WITH AMLODIPINE     [START ON 01/08/2024] topiramate  (TOPAMAX ) 25 MG tablet Take  1 tablet (25 mg total) by mouth 2 (two) times daily. 60 tablet 3   [START ON 01/08/2024] traZODone  (DESYREL ) 50 MG tablet Take 1 tablet (50 mg total) by mouth at bedtime as needed for sleep. 30 tablet 5   No current facility-administered medications for this visit.     Musculoskeletal: Strength & Muscle Tone: N/A Gait & Station: N/A Patient leans: N/A  Psychiatric Specialty Exam: Review of Systems  Psychiatric/Behavioral:  Positive for decreased concentration, dysphoric mood and sleep disturbance. Negative for agitation, behavioral problems, confusion, hallucinations, self-injury and suicidal ideas. The patient is nervous/anxious. The patient is not hyperactive.   All other systems reviewed and are negative.   There were no vitals taken for this visit.There is no height or weight on file to calculate BMI.  General Appearance: Well Groomed  Eye Contact:  Good  Speech:  Clear and Coherent  Volume:  Normal  Mood:  Anxious and Depressed  Affect:  Appropriate, Congruent, and Tearful  Thought Process:  Coherent  Orientation:  Full (Time, Place, and Person)  Thought Content: Logical   Suicidal Thoughts:  No  Homicidal Thoughts:  No  Memory:  Immediate;   Good  Judgement:  Good  Insight:  Good  Psychomotor Activity:  Normal  Concentration:  Concentration: Fair and Attention Span: Fair  Recall:  Poor  Fund of Knowledge: Good  Language: Good  Akathisia:  No  Handed:  Right  AIMS (if indicated): not done  Assets:  Communication Skills Desire for Improvement  ADL's:  Intact  Cognition: WNL  Sleep:   Fair   Screenings: GAD-7    Flowsheet Row Office Visit from confidential encounter on 03/29/2022 Counselor from 08/23/2021 in [facility] [place]  Total GAD-7 Score 12 16      PHQ2-9    Flowsheet Row Office Visit from confidential encounter on 03/29/2022 Counselor from 08/23/2021 in [facility] [place] Video Visit from confidential encounter on 07/14/2021 Video Visit from confidential encounter on 03/10/2021 Video Visit from confidential encounter on 11/03/2020  PHQ-2 Total Score 4 3 4 2 2   PHQ-9 Total Score 11 10 19 11 11       Flowsheet Row Counselor from 08/23/2021 in [facility] [place] Video Visit from confidential encounter on 07/14/2021 Video Visit from confidential encounter on 03/10/2021  C-SSRS RISK CATEGORY Error: Q3, 4, or 5 should not be populated when Q2 is No Error: Q3, 4, or 5 should not be populated when Q2 is No Error: Q3, 4, or 5 should not be populated when Q2 is No        Assessment and Plan:  Troy Mahoney is a 33 y.o. year old male with a history of depression, hypertension, who presents for follow up appointment for below.    1. MDD (major depressive disorder), recurrent episode, moderate (HCC) 2. Anxiety disorder, unspecified type 3. Borderline personality disorder (HCC) # r/o with seasonal pattern Acute stressors include: work related stress, anniversary in March 24th Other stressors include:financial strain, lack of transportation,  loss of his father, his sister     History:   He reports worsening in depressive symptoms, anxiety, and PTSD like symptoms such as intrusive thoughts and a flashback in the context of having responsibility of the care for her mother, and repossession of the car.  Will start prazosin to target hyperarousal symptoms, and nightmares.  Discussed potential risk of dizziness, and orthostatic hypotension.  Will continue Lexapro  to target  depression and anxiety, along with bupropion  and Abilify  adjunctive treatment for depression.  He will greatly benefit from CBT; he has an upcoming appointment with Mr. [place] Hammans for therapy.   4. Binge eating Overall improving.  Will continue current dose of topiramate  to target binge eating, and medication-induced weight gain.   5. Insomnia, unspecified type Overall stable.  Will continue trazodone  as needed for insomnia.  It was previously discussed that he would contact the clinic for evaluation of sleep apnea given he has history of snoring, fatigue and middle insomnia.  We discussed this at his next visit.   4. High risk medication use       Last checked  EKG HR 91, QTc434msec 02/2022  Lipid panels   Due- he will be checked at PCP  HbA1c   Due- he will be checked at  PCP       Plan  Continue lexapro  20 mg daily  Continue bupropion  450 mg daily Continue Abilify  20 mg daily    Continue topiramate  25 mg twice a day Start prazosin 1 mg at night for 3 days, then 2 mg at night  Continue trazodone  25 -50 mg at night as needed  Referred for sleep evaluation Next appointment: 7.9 at 9 am, video  [email or web address]   Given his current symptoms, he would benefit from a work accommodation limiting his schedule to 4 hours per day in order to help prevent worsening of his mood symptoms.  He agrees to discuss this with HR. he will notify the office if any paperwork needs to be submitted.  He agrees to hold the current paperwork at this time.    Past trials of medication: sertraline  (insomnia) , bupropion , lamotrigine  (weight gain)   The patient demonstrates the following risk factors for suicide: Chronic risk factors for suicide include: psychiatric disorder of depression. Acute risk factors for suicide include: loss (financial, interpersonal, professional). Protective factors for this patient include: responsibility to others (children, family), coping skills and hope for the future. Considering  these factors, the overall suicide risk at this point appears to be low. He denies gun access at home. Patient is appropriate for outpatient follow up Emergency resources which includes 911, ED, suicide crisis line (988) are discussed.      Collaboration of Care: Collaboration of Care: Other reviewed notes in Epic  Patient/Guardian was advised Release of Information must be obtained prior to any record release in order to collaborate their care with an outside provider. Patient/Guardian was advised if they have not already done so to contact the registration department to sign all necessary forms in order for us  to release information regarding their care.   Consent: Patient/Guardian gives verbal consent for treatment and assignment of benefits for services provided during this visit. Patient/Guardian expressed understanding and agreed to proceed.    Todd Fossa, MD 12/06/2023, 5:10 PM

## 2023-12-06 ENCOUNTER — Encounter: Payer: Self-pay | Admitting: Psychiatry

## 2023-12-06 ENCOUNTER — Telehealth (INDEPENDENT_AMBULATORY_CARE_PROVIDER_SITE_OTHER): Admitting: Psychiatry

## 2023-12-06 DIAGNOSIS — F331 Major depressive disorder, recurrent, moderate: Secondary | ICD-10-CM | POA: Diagnosis not present

## 2023-12-06 DIAGNOSIS — F603 Borderline personality disorder: Secondary | ICD-10-CM

## 2023-12-06 DIAGNOSIS — G4700 Insomnia, unspecified: Secondary | ICD-10-CM

## 2023-12-06 DIAGNOSIS — R632 Polyphagia: Secondary | ICD-10-CM

## 2023-12-06 DIAGNOSIS — F419 Anxiety disorder, unspecified: Secondary | ICD-10-CM | POA: Diagnosis not present

## 2023-12-06 MED ORDER — PRAZOSIN HCL 2 MG PO CAPS: 2.0000 mg | ORAL_CAPSULE | Freq: Every day | ORAL | 1 refills | Status: DC

## 2023-12-06 MED ORDER — TOPIRAMATE 25 MG PO TABS: 25.0000 mg | ORAL_TABLET | Freq: Two times a day (BID) | ORAL | 3 refills | Status: DC

## 2023-12-06 MED ORDER — PRAZOSIN HCL 1 MG PO CAPS: 1.0000 mg | ORAL_CAPSULE | Freq: Every day | ORAL | 0 refills | Status: DC

## 2023-12-06 MED ORDER — TRAZODONE HCL 50 MG PO TABS: 50.0000 mg | ORAL_TABLET | Freq: Every evening | ORAL | 5 refills | Status: AC | PRN

## 2023-12-06 NOTE — Patient Instructions (Addendum)
 Continue lexapro  20 mg daily  Continue bupropion  450 mg daily Continue Abilify  20 mg daily    Continue topiramate  25 mg twice a day Start prazosin 1 mg at night for 3 days, then 2 mg at night  Continue trazodone  25 -50 mg at night as needed  Referred for sleep evaluation Next appointment: 7.9 at 9 am, video

## 2023-12-11 ENCOUNTER — Ambulatory Visit (INDEPENDENT_AMBULATORY_CARE_PROVIDER_SITE_OTHER): Admitting: Clinical

## 2023-12-11 DIAGNOSIS — F331 Major depressive disorder, recurrent, moderate: Secondary | ICD-10-CM | POA: Diagnosis not present

## 2023-12-11 DIAGNOSIS — F419 Anxiety disorder, unspecified: Secondary | ICD-10-CM
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
## 2024-01-07 NOTE — Progress Notes (Unsigned)
 Virtual Visit via Video Note  I connected with Troy Mahoney on 01/10/24 at  9:00 AM EDT by a video enabled telemedicine application and verified that I am speaking with the correct person using two identifiers.  Location: Patient: home Provider: home office Persons participated in the visit- patient, provider    I discussed the limitations of evaluation and management by telemedicine and the availability of in person appointments. The patient expressed understanding and agreed to proceed.    I discussed the assessment and treatment plan with the patient. The patient was provided an opportunity to ask questions and all were answered. The patient agreed with the plan and demonstrated an understanding of the instructions.   The patient was advised to call back or seek an in-person evaluation if the symptoms worsen or if the condition fails to improve as anticipated.    [name], MD    [facility] MD/PA/NP OP Progress Note  01/10/2024 9:41 AM [name]  MRN:  [id number]  Chief Complaint:  Chief Complaint  Patient presents with   Follow-up   HPI:  This is a follow-up appointment for depression, anxiety and insomnia.  He states that things has been consistent.  He finds prazosin  to be helpful for intrusive thoughts, although he did not recognize he had dose when he had it.  However, he had lightheadedness when he tried to get down.  He was taking the medication in the morning, and has been taking it at night.  Although he cut back exercise when he was experiencing dizziness, he is getting back to it this week.  He may have some dizziness when he works on [employer].  He reports better relationship with his sister.  She offered him to be his emotional support person.  Although he felt he was not heard in the past, he appreciates this offer.  He agrees that it also meant a lot especially he was hit by the loss of his paternal aunt.  He reports fatigue in the morning.  He sleeps up to 5  hours.  He tends to do a little more binge eating at night.  He denies SI.  He had significant anger against her mother when she was in [place] .  He thinks she is making irresponsible choices, and he did not want her grandchildren to be affected.  He denies HI.  He denies hallucinations.  Although he felt wary of returning to work even with the reduced hours, he now feels more comfortable now that medication dose will be reduced.  He agrees with the plans as outlined below.   Substance use   Tobacco Alcohol Other substances/  Current    denies Madness pre work out (310 mg caffeine), one coffee, otherwise denies  Past        Past Treatment             Visit Diagnosis:    ICD-10-CM   1. MDD (major depressive disorder), recurrent episode, moderate (HCC)  F33.1     2. Anxiety disorder, unspecified type  F41.9     3. Borderline personality disorder (HCC)  F60.3     4. Binge eating  R63.2       Past Psychiatric History: Please see initial evaluation for full details. I have reviewed the history. No updates at this time.     Past Medical History: History reviewed. No pertinent past medical history. History reviewed. No pertinent surgical history.  Family Psychiatric History: Please see initial evaluation for full details.  I have reviewed the history. No updates at this time.     Family History:  Family History  Problem Relation Age of Onset   Anxiety disorder Mother    Depression Mother    Anxiety disorder Maternal Aunt    Anxiety disorder Maternal Grandmother     Social History:  Social History   Socioeconomic History   Marital status: Single    Spouse name: Not on file   Number of children: Not on file   Years of education: Not on file   Highest education level: Not on file  Occupational History   Not on file  Tobacco Use   Smoking status: Never   Smokeless tobacco: Never  Vaping Use   Vaping status: Every Day   Substances: Flavoring  Substance and Sexual Activity    Alcohol use: Not Currently    Comment: occ.   Drug use: No   Sexual activity: Not on file  Other Topics Concern   Not on file  Social History Narrative   Not on file   Social Drivers of Health   Financial Resource Strain: Not on file  Food Insecurity: Not on file  Transportation Needs: Not on file  Physical Activity: Not on file  Stress: Not on file  Social Connections: Not on file    Allergies: No Known Allergies  Metabolic Disorder Labs: No results found for: HGBA1C, MPG No results found for: PROLACTIN No results found for: CHOL, TRIG, HDL, CHOLHDL, VLDL, LDLCALC Lab Results  Component Value Date   TSH 0.69 08/28/2018    Therapeutic Level Labs: No results found for: LITHIUM No results found for: VALPROATE No results found for: CBMZ  Current Medications: Current Outpatient Medications  Medication Sig Dispense Refill   prazosin  (MINIPRESS ) 1 MG capsule Take 1 capsule (1 mg total) by mouth at bedtime. 30 capsule 1   amLODipine (NORVASC) 5 MG tablet TAKE 1 TABLET BY MOUTH DAILY WITH OLMERSARTAN     ARIPiprazole  (ABILIFY ) 20 MG tablet Take 1 tablet (20 mg total) by mouth daily. 30 tablet 3   buPROPion  (WELLBUTRIN  XL) 150 MG 24 hr tablet Take 1 tablet (150 mg total) by mouth daily. Take total of 450 mg daily. Take along with 300 mg tab 30 tablet 5   buPROPion  (WELLBUTRIN  XL) 300 MG 24 hr tablet Take 1 tablet (300 mg total) by mouth daily. Take total of 450 mg daily. Take along with 150 mg tab 30 tablet 5   escitalopram  (LEXAPRO ) 20 MG tablet Take 1 tablet (20 mg total) by mouth daily. 30 tablet 5   hydrochlorothiazide (MICROZIDE) 12.5 MG capsule Take 12.5 mg by mouth daily.     ibuprofen (ADVIL,MOTRIN) 200 MG tablet Take 200 mg by mouth every 6 (six) hours as needed for mild pain or moderate pain.     olmesartan (BENICAR) 20 MG tablet TAKE 1 TABLET BY MOUTH DAILY FOR BLOOD PRESSURE WITH AMLODIPINE     prazosin  (MINIPRESS ) 1 MG capsule Take 1  capsule (1 mg total) by mouth at bedtime for 3 days. 3 capsule 0   topiramate  (TOPAMAX ) 25 MG tablet Take 1 tablet (25 mg total) by mouth 2 (two) times daily. 60 tablet 3   traZODone  (DESYREL ) 50 MG tablet Take 1 tablet (50 mg total) by mouth at bedtime as needed for sleep. 30 tablet 5   No current facility-administered medications for this visit.     Musculoskeletal: Strength & Muscle Tone: N/A Gait & Station: N/A Patient leans: N/A  Psychiatric  Specialty Exam: Review of Systems  Psychiatric/Behavioral:  Positive for dysphoric mood and sleep disturbance. Negative for agitation, behavioral problems, confusion, decreased concentration, hallucinations, self-injury and suicidal ideas. The patient is nervous/anxious. The patient is not hyperactive.   All other systems reviewed and are negative.   There were no vitals taken for this visit.There is no height or weight on file to calculate BMI.  General Appearance: Well Groomed  Eye Contact:  Good  Speech:  Clear and Coherent  Volume:  Normal  Mood:  tired  Affect:  Appropriate, Congruent, and more relaxed  Thought Process:  Coherent  Orientation:  Full (Time, Place, and Person)  Thought Content: Logical   Suicidal Thoughts:  No  Homicidal Thoughts:  No  Memory:  Immediate;   Good  Judgement:  Good  Insight:  Good  Psychomotor Activity:  Normal  Concentration:  Concentration: Good and Attention Span: Good  Recall:  Good  Fund of Knowledge: Good  Language: Good  Akathisia:  No  Handed:  Right  AIMS (if indicated): not done  Assets:  Communication Skills Desire for Improvement  ADL's:  Intact  Cognition: WNL  Sleep:  Poor   Screenings: GAD-7    Advertising copywriter from 12/11/2023 in [facility] [place] Office Visit from confidential encounter on 03/29/2022 Counselor from 08/23/2021 in [facility] [place]  Total GAD-7 Score 21 12 16    PHQ2-9     Flowsheet Row Counselor from 12/11/2023 in [facility] [place] Office Visit from confidential encounter on 03/29/2022 Counselor from 08/23/2021 in [facility] at [place] Video Visit from confidential encounter on 07/14/2021 Video Visit from confidential encounter on 03/10/2021  PHQ-2 Total Score 6 4 3 4 2   PHQ-9 Total Score 14 11 10 19 11    Flowsheet Row Counselor from 12/11/2023 in [facility] [place] Counselor from 08/23/2021 in [facility] [place] Video Visit from confidential encounter on 07/14/2021  C-SSRS RISK CATEGORY Error: Question 6 not populated Error: Q3, 4, or 5 should not be populated when Q2 is No Error: Q3, 4, or 5 should not be populated when Q2 is No     Assessment and Plan:  ADARIAN BUR is a 33 y.o. year old male with a history of depression, hypertension, who presents for follow up appointment for below.   1. MDD (major depressive disorder), recurrent episode, moderate (HCC) 2. Anxiety disorder, unspecified type 3. Borderline personality disorder (HCC) # r/o with seasonal pattern Acute stressors include: work related stress, anniversary in March 24th Other stressors include:financial strain, lack of transportation,  loss of his father, his sister     History:    The exam is notable for calmer affect, and he reports there were intrusive thoughts since starting prazosin . He also reports forming a better relationship with his sister, although he experienced significant conflict with his mother, largely due to concerns about her grandchildren.  We will reduce prazosin  given he reports orthostatic hypotension like symptoms, while he has benefit from this medication.  Will continue Lexapro  to target depression and anxiety.  Will continue bupropion  and Abilify  adjunctive treatment for depression.  Psychoeducation was provided regarding the daily  structure/behavioral activation.  He will continue to work on therapy with Mr. Franchot.   # Binge eating  Slight worsening.  Will continue current dose of topiramate  to target binge eating, and medication-induced weight gain.  Will consider  uptitration if any worsening.   # Insomnia Slight worsening in the context of not doing exercise as he used to.  He will be working on behavioral activation.  Will continue trazodone  as needed for insomnia. It was previously discussed that he would contact the clinic for evaluation of sleep apnea given he has history of snoring, fatigue and middle insomnia.  We discussed this at his next visit.    4. High risk medication use       Last checked  EKG HR 91, QTc497msec 02/2022  Lipid panels   Due- he will be checked at PCP  HbA1c   Due- he will be checked at  PCP       Plan  Continue lexapro  20 mg daily  Continue bupropion  450 mg daily Continue Abilify  20 mg daily    Continue topiramate  25 mg twice a day Reduce prazosin  1 mg at night - 2 mg caused dizziness Continue trazodone  25 -50 mg at night as needed  Referred for sleep evaluation Next appointment: 8/12 at 10 am, video  [email or web address]   Given his current symptoms, he would benefit from a work accommodation limiting his schedule to 4 hours per day in order to help prevent worsening of his mood symptoms.  He agrees to discuss this with HR. he will notify the office if any paperwork needs to be submitted.  He agrees to hold the current paperwork at this time.    Past trials of medication: sertraline  (insomnia) , bupropion , lamotrigine  (weight gain)   The patient demonstrates the following risk factors for suicide: Chronic risk factors for suicide include: psychiatric disorder of depression. Acute risk factors for suicide include: loss (financial, interpersonal, professional). Protective factors for this patient include: responsibility to others (children, family), coping skills and hope for the  future. Considering these factors, the overall suicide risk at this point appears to be low. He denies gun access at home. Patient is appropriate for outpatient follow up Emergency resources which includes 911, ED, suicide crisis line (988) are discussed.      Collaboration of Care: Collaboration of Care: Other reviewed notes in Epic  Patient/Guardian was advised Release of Information must be obtained prior to any record release in order to collaborate their care with an outside provider. Patient/Guardian was advised if they have not already done so to contact the registration department to sign all necessary forms in order for us  to release information regarding their care.   Consent: Patient/Guardian gives verbal consent for treatment and assignment of benefits for services provided during this visit. Patient/Guardian expressed understanding and agreed to proceed.    [name], MD 01/10/2024, 9:41 AM

## 2024-01-08 ENCOUNTER — Ambulatory Visit (INDEPENDENT_AMBULATORY_CARE_PROVIDER_SITE_OTHER): Admitting: Clinical

## 2024-01-08 DIAGNOSIS — F419 Anxiety disorder, unspecified: Secondary | ICD-10-CM | POA: Diagnosis not present

## 2024-01-08 DIAGNOSIS — F331 Major depressive disorder, recurrent, moderate: Secondary | ICD-10-CM

## 2024-01-08 NOTE — Progress Notes (Signed)
 Virtual Visit via Video Note   I connected with Troy Mahoney on 01/08/24 at 11:00 AM EST by a video enabled telemedicine application and verified that I am speaking with the correct person using two identifiers.   Location: Patient: Home Provider: Office   I discussed the limitations of evaluation and management by telemedicine and the availability of in person appointments. The patient expressed understanding and agreed to proceed.       THERAPIST PROGRESS NOTE   Session Time: 11:00 AM-11:45 AM   Participation Level: Active   Behavioral Response: CasualAlertDepressed   Type of Therapy: Individual Therapy   Treatment Goals addressed: Coping   Interventions: CBT, Motivational Interviewing, Solution Focused and Supportive   Summary: Troy Mahoney is a 33 y.o. male who presents with Depression and Anxiety The OPT therapist worked with the patient for his OPT treatment. The OPT therapist utilized Motivational Interviewing to assist in creating therapeutic repore. The patient in the session was engaged and spoke about ongoing difficulty with creating balance due to a self proclaimed high work ethic which has been creating diminished returns in other areas of his life. The patient spoke about the impact of his med therapy noticing improvements in symptom management, but concern with feeling of dizzyness and will be follow up with Dr. Hisada on 01/10/24 for his med therapy.The OPT therapist utilized Cognitive Behavioral Therapy through cognitive restructuring as well as worked with the patient on coping strategies to assist in management of Depression and Anxiety. The OPT therapist worked with the patient to manage current stressors.   Suicidal/Homicidal: Nowithout intent/plan   Therapist Response: The OPT therapist worked with the patient for the patients scheduled session. The patient was engaged in his session and gave feedback in relation to triggers, symptoms, and behavior responses  over the past few weeks. The OPT therapist worked with the patient utilizing an in session Cognitive Behavioral Therapy exercise.The OPT therapist worked with the patient on postive thinking The patient was responsive in the session and verbalized,  I have noticed the medication has helped me with managing self critical thoughts. The OPT therapist worked with the patient on time management, prioritizing,management of basic needs, and creating balance across his life sectors. The OPT therapist will continue treatment work with the patient in his next scheduled session   Plan: Return again in 2/3 weeks.   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 Vickey.   Patient/Guardian was advised Release of Information must be obtained prior to any record release in order to collaborate their care with an outside provider. Patient/Guardian was advised if they have not already done so to contact the registration department to sign all necessary forms in order for us  to release information regarding their care.    Consent: Patient/Guardian gives verbal consent for treatment and assignment of benefits for services provided during this visit. Patient/Guardian expressed understanding and agreed to proceed.      I discussed the assessment and treatment plan with the patient. The patient was provided an opportunity to ask questions and all were answered. The patient agreed with the plan and demonstrated an understanding of the instructions.  The patient was advised to call back or seek an in-person evaluation if the symptoms worsen or if the condition fails to improve as anticipated.   I provided 45 minutes of non-face-to-face time during this encounter   Jerel T.Franchot, LCSW   01/08/2024

## 2024-01-10 ENCOUNTER — Telehealth (INDEPENDENT_AMBULATORY_CARE_PROVIDER_SITE_OTHER): Admitting: Psychiatry

## 2024-01-10 ENCOUNTER — Encounter: Payer: Self-pay | Admitting: Psychiatry

## 2024-01-10 DIAGNOSIS — R632 Polyphagia: Secondary | ICD-10-CM

## 2024-01-10 DIAGNOSIS — F603 Borderline personality disorder: Secondary | ICD-10-CM

## 2024-01-10 DIAGNOSIS — F419 Anxiety disorder, unspecified: Secondary | ICD-10-CM

## 2024-01-10 DIAGNOSIS — F331 Major depressive disorder, recurrent, moderate: Secondary | ICD-10-CM | POA: Diagnosis not present

## 2024-01-10 MED ORDER — PRAZOSIN HCL 1 MG PO CAPS: 1.0000 mg | ORAL_CAPSULE | Freq: Every day | ORAL | 1 refills | Status: DC

## 2024-01-10 MED ORDER — ESCITALOPRAM OXALATE 20 MG PO TABS: 20.0000 mg | ORAL_TABLET | Freq: Every day | ORAL | 5 refills | Status: AC

## 2024-01-10 NOTE — Patient Instructions (Signed)
 Continue lexapro  20 mg daily  Continue bupropion  450 mg daily Continue Abilify  20 mg daily    Continue topiramate  25 mg twice a day Reduce prazosin  1 mg at night  Continue trazodone  25 -50 mg at night as needed  Referred for sleep evaluation Next appointment: 8/12 at 10 am

## 2024-02-05 ENCOUNTER — Ambulatory Visit (INDEPENDENT_AMBULATORY_CARE_PROVIDER_SITE_OTHER): Admitting: Clinical

## 2024-02-05 DIAGNOSIS — F419 Anxiety disorder, unspecified: Secondary | ICD-10-CM

## 2024-02-05 DIAGNOSIS — F331 Major depressive disorder, recurrent, moderate: Secondary | ICD-10-CM | POA: Diagnosis not present

## 2024-02-05 NOTE — Progress Notes (Signed)
 Virtual Visit via Video Note   I connected with Troy Mahoney on 02/05/24 at 1:00 PM EST by a video enabled telemedicine application and verified that I am speaking with the correct person using two identifiers.   Location: Patient: Home Provider: Office   I discussed the limitations of evaluation and management by telemedicine and the availability of in person appointments. The patient expressed understanding and agreed to proceed.       THERAPIST PROGRESS NOTE   Session Time: 1:00 PM-1:45 PM   Participation Level: Active   Behavioral Response: CasualAlertDepressed   Type of Therapy: Individual Therapy   Treatment Goals addressed: Coping   Interventions: CBT, Motivational Interviewing, Solution Focused and Supportive   Summary: Troy Mahoney is a 33 y.o. male who presents with Depression and Anxiety The OPT therapist worked with the patient for his OPT treatment. The OPT therapist utilized Motivational Interviewing to assist in creating therapeutic repore. The patient in the session was engaged and spoke about ongoing difficulty with creating balance due to a self proclaimed high work ethic which has been creating diminished returns in other areas of his life. The patient spoke about his intent to talk with his supervisor and request a different work structure with less hours.The OPT therapist utilized Cognitive Behavioral Therapy through cognitive restructuring as well as worked with the patient on coping strategies to assist in management of Depression and Anxiety. The OPT therapist worked with the patient on self awareness of when he needs to inject coping and meaningful distractions.   Suicidal/Homicidal: Nowithout intent/plan   Therapist Response: The OPT therapist worked with the patient for the patients scheduled session. The patient was engaged in his session and gave feedback in relation to triggers, symptoms, and behavior responses over the past few weeks. The OPT  therapist worked with the patient utilizing an in session Cognitive Behavioral Therapy exercise.The OPT therapist worked with the patient on postive thinking. The OPT therapist worked with the patient on time management and prioritizing health. The patient in this session verbalized,  I realize that I have a lot of work to do on myself and that I am focused on my goals sometimes to focused and it creates a lot of stress. The OPT therapist worked with the patient on stress management , not neglecting his basic needs, and creating balance across his life sectors. The OPT therapist will continue treatment work with the patient in his next scheduled session   Plan: Return again in 2/3 weeks.   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 Vickey.   Patient/Guardian was advised Release of Information must be obtained prior to any record release in order to collaborate their care with an outside provider. Patient/Guardian was advised if they have not already done so to contact the registration department to sign all necessary forms in order for us  to release information regarding their care.    Consent: Patient/Guardian gives verbal consent for treatment and assignment of benefits for services provided during this visit. Patient/Guardian expressed understanding and agreed to proceed.      I discussed the assessment and treatment plan with the patient. The patient  was provided an opportunity to ask questions and all were answered. The patient agreed with the plan and demonstrated an understanding of the instructions.   The patient was advised to call back or seek an in-person evaluation if the symptoms worsen or if the condition fails to improve as anticipated.   I provided 45 minutes of  non-face-to-face time during this encounter   Jerel T.Franchot, LCSW   02/05/2024

## 2024-02-08 ENCOUNTER — Telehealth: Payer: Self-pay | Admitting: Psychiatry

## 2024-02-08 NOTE — Telephone Encounter (Signed)
 The form has been completed. Please send it to them as requested.

## 2024-02-09 NOTE — Progress Notes (Signed)
 Virtual Visit via Video Note  I connected with Troy Mahoney on 02/13/24 at 10:00 AM EDT by a video enabled telemedicine application and verified that I am speaking with the correct person using two identifiers.  Location: Patient: home Provider: office Persons participated in the visit- patient, provider    I discussed the limitations of evaluation and management by telemedicine and the availability of in person appointments. The patient expressed understanding and agreed to proceed.     I discussed the assessment and treatment plan with the patient. The patient was provided an opportunity to ask questions and all were answered. The patient agreed with the plan and demonstrated an understanding of the instructions.   The patient was advised to call back or seek an in-person evaluation if the symptoms worsen or if the condition fails to improve as anticipated.   [name], MD    [facility] MD/PA/NP OP Progress Note  02/13/2024 10:43 AM Troy Mahoney  MRN:  [id number]  Chief Complaint:  Chief Complaint  Patient presents with   Follow-up   HPI:  This is a follow up appointment for depression, anxiety, insomnia and binge eating He states that he was doing well until a week ago when he was switched back to full-time work.  He was very stressed due to high volume call. Phone neve stops.  Although he used to be able to handle better when he was on reduced hours, it has been significantly challenging.  He barely was able to make people 4 hours.  He has started to consume caffeine so that he can have energy and to stay focused.  He started to have insomnia.  He was awake until 4 AM.  He also reports difficulty in change in the work time.  He is considering to switch to a part-time job as it has been so difficult, and has been communicating with his supervisor.  He does not have time to think about SI  He has been doing workout every other day.  He denies hallucinations.  He has been eating  1 meal, and denies binge eating except he did have 1 episode of eating oleos.  He agrees with the plans as outlined below.   225 lbs Wt Readings from Last 3 Encounters:  08/29/23 254 lb 3.2 oz (115.3 kg)  03/29/22 271 lb (122.9 kg)  09/25/18 248 lb (112.5 kg)     Substance use   Tobacco Alcohol Other substances/  Current    denies Full scoop of Madness pre work out (310 mg caffeine), one coffee, otherwise denies  Past        Past Treatment             Visit Diagnosis:    ICD-10-CM   1. MDD (major depressive disorder), recurrent episode, moderate (HCC)  F33.1     2. Anxiety disorder, unspecified type  F41.9     3. Borderline personality disorder (HCC)  F60.3     4. Binge eating  R63.2     5. Insomnia, unspecified type  G47.00       Past Psychiatric History: Please see initial evaluation for full details. I have reviewed the history. No updates at this time.     Past Medical History: History reviewed. No pertinent past medical history. History reviewed. No pertinent surgical history.  Family Psychiatric History: Please see initial evaluation for full details. I have reviewed the history. No updates at this time.     Family History:  Family History  Problem Relation Age of Onset   Anxiety disorder Mother    Depression Mother    Anxiety disorder Maternal Aunt    Anxiety disorder Maternal Grandmother     Social History:  Social History   Socioeconomic History   Marital status: Single    Spouse name: Not on file   Number of children: Not on file   Years of education: Not on file   Highest education level: Not on file  Occupational History   Not on file  Tobacco Use   Smoking status: Never   Smokeless tobacco: Never  Vaping Use   Vaping status: Every Day   Substances: Flavoring  Substance and Sexual Activity   Alcohol use: Not Currently    Comment: occ.   Drug use: No   Sexual activity: Not on file  Other Topics Concern   Not on file  Social History  Narrative   Not on file   Social Drivers of Health   Financial Resource Strain: Not on file  Food Insecurity: Not on file  Transportation Needs: Not on file  Physical Activity: Not on file  Stress: Not on file  Social Connections: Not on file    Allergies: No Known Allergies  Metabolic Disorder Labs: No results found for: HGBA1C, MPG No results found for: PROLACTIN No results found for: CHOL, TRIG, HDL, CHOLHDL, VLDL, LDLCALC Lab Results  Component Value Date   TSH 0.69 08/28/2018    Therapeutic Level Labs: No results found for: LITHIUM No results found for: VALPROATE No results found for: CBMZ  Current Medications: Current Outpatient Medications  Medication Sig Dispense Refill   amLODipine (NORVASC) 5 MG tablet TAKE 1 TABLET BY MOUTH DAILY WITH OLMERSARTAN     ARIPiprazole  (ABILIFY ) 20 MG tablet Take 1 tablet (20 mg total) by mouth daily. 30 tablet 3   buPROPion  (WELLBUTRIN  XL) 150 MG 24 hr tablet Take 1 tablet (150 mg total) by mouth daily. Take total of 450 mg daily. Take along with 300 mg tab 30 tablet 5   buPROPion  (WELLBUTRIN  XL) 300 MG 24 hr tablet Take 1 tablet (300 mg total) by mouth daily. Take total of 450 mg daily. Take along with 150 mg tab 30 tablet 5   escitalopram  (LEXAPRO ) 20 MG tablet Take 1 tablet (20 mg total) by mouth daily. 30 tablet 5   hydrochlorothiazide (MICROZIDE) 12.5 MG capsule Take 12.5 mg by mouth daily.     ibuprofen (ADVIL,MOTRIN) 200 MG tablet Take 200 mg by mouth every 6 (six) hours as needed for mild pain or moderate pain.     olmesartan (BENICAR) 20 MG tablet TAKE 1 TABLET BY MOUTH DAILY FOR BLOOD PRESSURE WITH AMLODIPINE     [START ON 03/10/2024] prazosin  (MINIPRESS ) 1 MG capsule Take 1 capsule (1 mg total) by mouth at bedtime. 30 capsule 3   topiramate  (TOPAMAX ) 25 MG tablet Take 1 tablet (25 mg total) by mouth 2 (two) times daily. 60 tablet 3   traZODone  (DESYREL ) 50 MG tablet Take 1 tablet (50 mg total) by mouth  at bedtime as needed for sleep. 30 tablet 5   No current facility-administered medications for this visit.     Musculoskeletal: Strength & Muscle Tone: N/A Gait & Station: N/A Patient leans: N/A  Psychiatric Specialty Exam: Review of Systems  Psychiatric/Behavioral:  Positive for decreased concentration, dysphoric mood and sleep disturbance. Negative for agitation, behavioral problems, confusion, hallucinations, self-injury and suicidal ideas. The patient is nervous/anxious. The patient is not hyperactive.   All  other systems reviewed and are negative.   There were no vitals taken for this visit.There is no height or weight on file to calculate BMI.  General Appearance: Well Groomed  Eye Contact:  Good  Speech:  Clear and Coherent  Volume:  Normal  Mood:  overwhelmed  Affect:  Appropriate, Congruent, and calm  Thought Process:  Coherent  Orientation:  Full (Time, Place, and Person)  Thought Content: Logical   Suicidal Thoughts:  No  Homicidal Thoughts:  No  Memory:  Immediate;   Good  Judgement:  Good  Insight:  Good  Psychomotor Activity:  Normal  Concentration:  Concentration: Good and Attention Span: Good  Recall:  Good  Fund of Knowledge: Good  Language: Good  Akathisia:  No  Handed:  Right  AIMS (if indicated): not done  Assets:  Communication Skills Desire for Improvement  ADL's:  Intact  Cognition: WNL  Sleep:  Poor   Screenings: GAD-7    Advertising copywriter from 12/11/2023 in [facility] [place] Office Visit from confidential encounter on 03/29/2022 Counselor from 08/23/2021 in [facility] at [place]  Total GAD-7 Score 21 12 16    PHQ2-9    Flowsheet Row Counselor from 12/11/2023 in [facility] [place] Office Visit from confidential encounter on 03/29/2022 Counselor from 08/23/2021 in [facility] at [place] Video Visit from  confidential encounter on 07/14/2021 Video Visit from confidential encounter on 03/10/2021  PHQ-2 Total Score 6 4 3 4 2   PHQ-9 Total Score 14 11 10 19 11    Flowsheet Row Counselor from 12/11/2023 in [facility] [place] Counselor from 08/23/2021 in [facility] at [place] Video Visit from confidential encounter on 07/14/2021  C-SSRS RISK CATEGORY Error: Question 6 not populated Error: Q3, 4, or 5 should not be populated when Q2 is No Error: Q3, 4, or 5 should not be populated when Q2 is No     Assessment and Plan:  Troy Mahoney is a 33 y.o. year old male with a history of depression, hypertension, who presents for follow up appointment for below.    1. MDD (major depressive disorder), recurrent episode, moderate (HCC) 2. Anxiety disorder, unspecified type 3. Borderline personality disorder (HCC) # r/o with seasonal pattern Recently form a better relationship with her sister.  History:  There has been worsening in his mood, with sense of being overwhelmed in the context of his work schedule being back to full time, although he was doing very well when he was working reduced hours. Discussed behavioral activation and emphasized the importance of maintaining structure, as there has been a recurring pattern of him struggling to adhere to structured routines, which correlates with worsening mood symptoms.  Will continue current medication regimen at this time.  Will continue Lexapro  to target depression and anxiety.  Will continue bupropion  and Abilify  as adjunctive treatment for depression.  Will continue prazosin  for nightmares; it is noted that drowsiness has improved since reducing the dose.  Will greatly benefit from CBT; we will continue to see Mr. Franchot.   # Binge eating  Improving.  Will continue current dose of topiramate  to target binge eating, and for medication-induced weight gain.   # Insomnia Worsening in the context of  not adherent to routine.  Discussed behavioral activation.  Will continue trazodone  as needed for insomnia. It was previously discussed that he would contact the clinic for evaluation of  sleep apnea given he has history of snoring, fatigue and middle insomnia.  We discussed this at his next visit.    4. High risk medication use       Last checked  EKG HR 91, QTc466msec 02/2022  Lipid panels   Due- he will be checked at PCP  HbA1c   Due- he will be checked at  PCP       Plan  Continue lexapro  20 mg daily  Continue bupropion  450 mg daily Continue Abilify  20 mg daily    Continue topiramate  25 mg twice a day Reduce prazosin  1 mg at night - 2 mg caused dizziness Continue trazodone  25 -50 mg at night as needed  Referred for sleep evaluation Next appointment:  9/29 at 8 am video  [email or web address]    Given his current symptoms, he would benefit from a work accommodation limiting his schedule to 4 hours per day in order to help prevent worsening of his mood symptoms.  He agrees to discuss this with HR. he will notify the office if any paperwork needs to be submitted.    Past trials of medication: sertraline  (insomnia) , bupropion , lamotrigine  (weight gain)   The patient demonstrates the following risk factors for suicide: Chronic risk factors for suicide include: psychiatric disorder of depression. Acute risk factors for suicide include: loss (financial, interpersonal, professional). Protective factors for this patient include: responsibility to others (children, family), coping skills and hope for the future. Considering these factors, the overall suicide risk at this point appears to be low. He denies gun access at home. Patient is appropriate for outpatient follow up Emergency resources which includes 911, ED, suicide crisis line (988) are discussed.      Collaboration of Care: Collaboration of Care: Other reviewed notes in Epic  Patient/Guardian was advised Release of Information must be  obtained prior to any record release in order to collaborate their care with an outside provider. Patient/Guardian was advised if they have not already done so to contact the registration department to sign all necessary forms in order for us  to release information regarding their care.   Consent: Patient/Guardian gives verbal consent for treatment and assignment of benefits for services provided during this visit. Patient/Guardian expressed understanding and agreed to proceed.    [name], MD 02/13/2024, 10:43 AM

## 2024-02-13 ENCOUNTER — Encounter: Payer: Self-pay | Admitting: Psychiatry

## 2024-02-13 ENCOUNTER — Telehealth: Admitting: Psychiatry

## 2024-02-13 DIAGNOSIS — F419 Anxiety disorder, unspecified: Secondary | ICD-10-CM | POA: Diagnosis not present

## 2024-02-13 DIAGNOSIS — R632 Polyphagia: Secondary | ICD-10-CM

## 2024-02-13 DIAGNOSIS — F603 Borderline personality disorder: Secondary | ICD-10-CM | POA: Diagnosis not present

## 2024-02-13 DIAGNOSIS — F331 Major depressive disorder, recurrent, moderate: Secondary | ICD-10-CM

## 2024-02-13 DIAGNOSIS — G4700 Insomnia, unspecified: Secondary | ICD-10-CM

## 2024-02-13 MED ORDER — PRAZOSIN HCL 1 MG PO CAPS: 1.0000 mg | ORAL_CAPSULE | Freq: Every day | ORAL | 3 refills | Status: AC

## 2024-02-26 ENCOUNTER — Telehealth: Payer: Self-pay | Admitting: Psychiatry

## 2024-02-26 NOTE — Telephone Encounter (Signed)
 Patient states he is now requesting a full leave of absence from work. Looks like this was just filled out but for partial work. States he had some unforseen news and is stressful is why requesting full leave of absence. Paperwork is in your box.

## 2024-02-26 NOTE — Telephone Encounter (Signed)
 Please advise the patient to have a sooner follow-up visit to reassess and support him with paperwork and potential leave. I have availability this Wednesday it is works for him.

## 2024-03-02 NOTE — Progress Notes (Unsigned)
 Virtual Visit via Video Note  I connected with Troy Mahoney on 03/06/24 at 10:00 AM EDT by a video enabled telemedicine application and verified that I am speaking with the correct person using two identifiers.  Location: Patient: car Provider: home office Persons participated in the visit- patient, provider    I discussed the limitations of evaluation and management by telemedicine and the availability of in person appointments. The patient expressed understanding and agreed to proceed.    I discussed the assessment and treatment plan with the patient. The patient was provided an opportunity to ask questions and all were answered. The patient agreed with the plan and demonstrated an understanding of the instructions.   The patient was advised to call back or seek an in-person evaluation if the symptoms worsen or if the condition fails to improve as anticipated.    [name], MD    [facility] MD/PA/NP OP Progress Note  03/06/2024 10:32 AM Troy Mahoney  MRN:  [id number]  Chief Complaint:  Chief Complaint  Patient presents with   Follow-up   HPI:  This is a follow-up appointment for depression, anxiety, binge eating and insomnia.  He states that he cannot find work to express himself.  There was a big blow in the last few weeks.  He had a conversation at the work to get the part time.  Although he thought it went well, he received a letter about the termination of his job.  He was also informed by his mothers provider that she is back on the transplant list for kidney.  His mother's aunt passed away as well.  He feels that there is obstacles whenever he thought he is in a good place.  Although he had SI, it was subsided afterwards and denies any intent or plan.  He has been trying to stay positive and not losing focus.  He has been doing workout.  He is also considering to reach out to his sister as he is interested in going to school.  He has signed up on Facebook group for the  focus group for vendors.  His sleep has been fluctuating.  He is trying to limit caffeine intake.  He has been sleeping 5 hours at least.  He reports slight increase in binge eating.  He feels anxious at times.  Although he has occasional moments of wanting to punch others when he is frustrated, he adamantly denies any plan, intent or any act on these thoughts.  He agrees with the plans as outlined below.   Substance use   Tobacco Alcohol Other substances/  Current    denies Full scoop of Madness pre work out (310 mg caffeine), one coffee, otherwise denies  Past        Past Treatment           Visit Diagnosis:    ICD-10-CM   1. MDD (major depressive disorder), recurrent episode, moderate (HCC)  F33.1     2. Anxiety disorder, unspecified type  F41.9     3. Borderline personality disorder (HCC)  F60.3     4. Binge eating  R63.2     5. Insomnia, unspecified type  G47.00       Past Psychiatric History: Please see initial evaluation for full details. I have reviewed the history. No updates at this time.     Past Medical History: History reviewed. No pertinent past medical history. History reviewed. No pertinent surgical history.  Family Psychiatric History: Please see initial evaluation for  full details. I have reviewed the history. No updates at this time.     Family History:  Family History  Problem Relation Age of Onset   Anxiety disorder Mother    Depression Mother    Anxiety disorder Maternal Aunt    Anxiety disorder Maternal Grandmother     Social History:  Social History   Socioeconomic History   Marital status: Single    Spouse name: Not on file   Number of children: Not on file   Years of education: Not on file   Highest education level: Not on file  Occupational History   Not on file  Tobacco Use   Smoking status: Never   Smokeless tobacco: Never  Vaping Use   Vaping status: Every Day   Substances: Flavoring  Substance and Sexual Activity   Alcohol use:  Not Currently    Comment: occ.   Drug use: No   Sexual activity: Not on file  Other Topics Concern   Not on file  Social History Narrative   Not on file   Social Drivers of Health   Financial Resource Strain: Not on file  Food Insecurity: Not on file  Transportation Needs: Not on file  Physical Activity: Not on file  Stress: Not on file  Social Connections: Not on file    Allergies: No Known Allergies  Metabolic Disorder Labs: No results found for: HGBA1C, MPG No results found for: PROLACTIN No results found for: CHOL, TRIG, HDL, CHOLHDL, VLDL, LDLCALC Lab Results  Component Value Date   TSH 0.69 08/28/2018    Therapeutic Level Labs: No results found for: LITHIUM No results found for: VALPROATE No results found for: CBMZ  Current Medications: Current Outpatient Medications  Medication Sig Dispense Refill   amLODipine (NORVASC) 5 MG tablet TAKE 1 TABLET BY MOUTH DAILY WITH OLMERSARTAN     ARIPiprazole  (ABILIFY ) 20 MG tablet Take 1 tablet (20 mg total) by mouth daily. 30 tablet 3   buPROPion  (WELLBUTRIN  XL) 150 MG 24 hr tablet Take 1 tablet (150 mg total) by mouth daily. Take total of 450 mg daily. Take along with 300 mg tab 30 tablet 5   buPROPion  (WELLBUTRIN  XL) 300 MG 24 hr tablet Take 1 tablet (300 mg total) by mouth daily. Take total of 450 mg daily. Take along with 150 mg tab 30 tablet 5   escitalopram  (LEXAPRO ) 20 MG tablet Take 1 tablet (20 mg total) by mouth daily. 30 tablet 5   hydrochlorothiazide (MICROZIDE) 12.5 MG capsule Take 12.5 mg by mouth daily.     ibuprofen (ADVIL,MOTRIN) 200 MG tablet Take 200 mg by mouth every 6 (six) hours as needed for mild pain or moderate pain.     olmesartan (BENICAR) 20 MG tablet TAKE 1 TABLET BY MOUTH DAILY FOR BLOOD PRESSURE WITH AMLODIPINE     [START ON 03/10/2024] prazosin  (MINIPRESS ) 1 MG capsule Take 1 capsule (1 mg total) by mouth at bedtime. 30 capsule 3   topiramate  (TOPAMAX ) 25 MG tablet Take 1  tablet (25 mg total) by mouth 2 (two) times daily. 60 tablet 3   traZODone  (DESYREL ) 50 MG tablet Take 1 tablet (50 mg total) by mouth at bedtime as needed for sleep. 30 tablet 5   No current facility-administered medications for this visit.     Musculoskeletal: Strength & Muscle Tone: N/A Gait & Station: N/A Patient leans: N/A  Psychiatric Specialty Exam: Review of Systems  Psychiatric/Behavioral:  Positive for decreased concentration, dysphoric mood, sleep disturbance and suicidal  ideas. Negative for agitation, behavioral problems, confusion, hallucinations and self-injury. The patient is nervous/anxious. The patient is not hyperactive.   All other systems reviewed and are negative.   There were no vitals taken for this visit.There is no height or weight on file to calculate BMI.  General Appearance: Well Groomed  Eye Contact:  Good  Speech:  Clear and Coherent  Volume:  Normal  Mood:  not good  Affect:  Appropriate, Congruent, and calm  Thought Process:  Coherent  Orientation:  Full (Time, Place, and Person)  Thought Content: Logical   Suicidal Thoughts:  No  Homicidal Thoughts:  No  Memory:  Immediate;   Good  Judgement:  Good  Insight:  Good  Psychomotor Activity:  Normal  Concentration:  Concentration: Good and Attention Span: Good  Recall:  Good  Fund of Knowledge: Good  Language: Good  Akathisia:  No  Handed:  Right  AIMS (if indicated): not done  Assets:  Communication Skills Desire for Improvement  ADL's:  Intact  Cognition: WNL  Sleep:  Poor   Screenings: GAD-7    Advertising copywriter from 12/11/2023 in [facility] [place] [facility] Visit from confidential encounter on 03/29/2022 Counselor from 08/23/2021 in [facility] [place]  Total GAD-7 Score 21 12 16    PHQ2-9    Flowsheet Row Counselor from 12/11/2023 in [facility] [place] Office Visit from  confidential encounter on 03/29/2022 Counselor from 08/23/2021 in [facility] [place] Video Visit from confidential encounter on 07/14/2021 Video Visit from confidential encounter on 03/10/2021  PHQ-2 Total Score 6 4 3 4 2   PHQ-9 Total Score 14 11 10 19 11    Flowsheet Row Counselor from 12/11/2023 in [facility] [place] Counselor from 08/23/2021 in [facility] [employer] Video Visit from confidential encounter on 07/14/2021  C-SSRS RISK CATEGORY Error: Question 6 not populated Error: Q3, 4, or 5 should not be populated when Q2 is No Error: Q3, 4, or 5 should not be populated when Q2 is No     Assessment and Plan:  Troy Mahoney is a 33 y.o. year old male with a history of depression, hypertension, who presents for follow up appointment for below.   1. MDD (major depressive disorder), recurrent episode, moderate (HCC) 2. Anxiety disorder, unspecified type 3. 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 He reports better relationship with his sister.  History:   Although there was worsening in depressive symptoms in the context of laid off from work, his mother being back on the renal transplant, he has been able to maintain structured routine, including doing a workout.  He agrees to maintain the current medication regimen given the recent mood episode is likely more situational.  On the positive note, he has been actively working with his therapist, and is considering to pursue going to a school.  Will continue Lexapro  to target depression and anxiety.  Will continue bupropion  and Abilify  as adjunctive treatment for depression.  Will continue prazosin  for nightmares.   4. Binge eating He reports slight worsening related to his mood symptoms.  Will continue current dose of topiramate  at this time to target binge  eating and medication induced weight gain.   5. Insomnia, unspecified type Stable related to his mood symptoms as outlined above.  Will  continue current dose of trazodone  as needed for insomnia. It was previously discussed that he would contact the clinic for evaluation of sleep apnea given he has history of snoring, fatigue and middle insomnia.  Will discuss this at his next visit.    4. High risk medication use       Last checked  EKG HR 91, QTc493msec 02/2022  Lipid panels   Due- he will be checked at PCP  HbA1c   Due- he will be checked at  PCP       Plan  Continue lexapro  20 mg daily  Continue bupropion  450 mg daily Continue Abilify  20 mg daily    Continue topiramate  25 mg twice a day Continue prazosin  1 mg at night - 2 mg caused dizziness Continue trazodone  25 -50 mg at night as needed  Referred for sleep evaluation Next appointment:  9/29 at 8 am video  [email or web address]   He declined the needs to fill out any additional paperwork.  Past trials of medication: sertraline  (insomnia) , bupropion , lamotrigine  (weight gain)   The patient demonstrates the following risk factors for suicide: Chronic risk factors for suicide include: psychiatric disorder of depression. Acute risk factors for suicide include: loss (financial, interpersonal, professional). Protective factors for this patient include: responsibility to others (children, family), coping skills and hope for the future. Considering these factors, the overall suicide risk at this point appears to be low. He denies gun access at home. Patient is appropriate for outpatient follow up Emergency resources which includes 911, ED, suicide crisis line (988) are discussed.    Collaboration of Care: Collaboration of Care: Other reviewed notes in Epic  Patient/Guardian was advised Release of Information must be obtained prior to any record release in order to collaborate their care with an outside provider. Patient/Guardian was advised  if they have not already done so to contact the registration department to sign all necessary forms in order for us  to release information regarding their care.   Consent: Patient/Guardian gives verbal consent for treatment and assignment of benefits for services provided during this visit. Patient/Guardian expressed understanding and agreed to proceed.    [name], MD 03/06/2024, 10:32 AM

## 2024-03-05 ENCOUNTER — Ambulatory Visit (INDEPENDENT_AMBULATORY_CARE_PROVIDER_SITE_OTHER): Admitting: Clinical

## 2024-03-05 DIAGNOSIS — F419 Anxiety disorder, unspecified: Secondary | ICD-10-CM | POA: Diagnosis not present

## 2024-03-05 DIAGNOSIS — F331 Major depressive disorder, recurrent, moderate: Secondary | ICD-10-CM

## 2024-03-05 NOTE — Progress Notes (Signed)
 Virtual Visit via Video Note   I connected with Troy Mahoney on 03/05/24 at 1:00 PM EST by a video enabled telemedicine application and verified that I am speaking with the correct person using two identifiers.   Location: Patient: Home Provider: Office   I discussed the limitations of evaluation and management by telemedicine and the availability of in person appointments. The patient expressed understanding and agreed to proceed.       THERAPIST PROGRESS NOTE   Session Time: 1:00 PM-1:30 PM   Participation Level: Active   Behavioral Response: CasualAlertDepressed   Type of Therapy: Individual Therapy   Treatment Goals addressed: Coping   Interventions: CBT, Motivational Interviewing, Solution Focused and Supportive   Summary: Troy Mahoney is a 33 y.o. male who presents with Depression and Anxiety The OPT therapist worked with the patient for his OPT treatment. The OPT therapist utilized Motivational Interviewing to assist in creating therapeutic repore. The patient in the session was engaged and spoke about changes since last session including getting terminated and let go with a severance package that will last him roughly 2 months. Additionally the patient spoke about learning his Mother will need another kidney transplant. The patient spoke about his focus currently on finding a new job but also working to stay positive. The OPT therapist worked with the patient utilizing in session practice of in my control vs not in my control. .The OPT therapist utilized Cognitive Behavioral Therapy through cognitive restructuring as well as worked with the patient on coping strategies to assist in management of Depression and Anxiety. The OPT therapist worked with the patient on self awareness of when he needs to inject coping and meaningful distractions. The OPT therapist overviewed with the patient his basic need areas and ensuring to continue to manage his exercise, eating habits , sleep  cycle, and hygiene. The patient spoke about having a positive lens working on not holding himself accountable in areas he does not have direct control of change in and feeling better about his situation with having support of therapy.   Suicidal/Homicidal: Nowithout intent/plan   Therapist Response: The OPT therapist worked with the patient for the patients scheduled session. The patient was engaged in his session and gave feedback in relation to triggers, symptoms, and behavior responses over the past few weeks. The patient spoke about large changes since his last session including getting terminated and let go with a severance package that will last him roughly 2 months. Additionally the patient spoke about learning his Mother will need another kidney transplant The OPT therapist worked with the patient utilizing an in session Cognitive Behavioral Therapy exercise.The OPT therapist worked with the patient on postive thinking. The OPT therapist worked with the patient on time management and prioritizing health. The patient in this session verbalized,  I realize that I have to give myself grace and work on things day by day and not get tangled in worrying but use my distractions when I need to. The OPT therapist worked with the patient on stress management , not neglecting his basic needs, and creating balance across his life sectors while working on what is in his control and not fixating on things that are outside of his control. The OPT therapist will continue treatment work with the patient in his next scheduled session   Plan: Return again in 2/3 weeks.   Diagnosis:      Axis I: Recurrent moderate major depressive disorder with anxiety  Axis II: No diagnosis       Collaboration of Care: The OPT therapist overviewed the patient involvement in the med management program with Dr. Vickey.   Patient/Guardian was advised Release of Information must be obtained prior to any  record release in order to collaborate their care with an outside provider. Patient/Guardian was advised if they have not already done so to contact the registration department to sign all necessary forms in order for us  to release information regarding their care.    Consent: Patient/Guardian gives verbal consent for treatment and assignment of benefits for services provided during this visit. Patient/Guardian expressed understanding and agreed to proceed.      I discussed the assessment and treatment plan with the patient. The patient was provided an opportunity to ask questions and all were answered. The patient agreed with the plan and demonstrated an understanding of the instructions.   The patient was advised to call back or seek an in-person evaluation if the symptoms worsen or if the condition fails to improve as anticipated.   I provided 35 minutes of non-face-to-face time during this encounter   Jerel T.Franchot, LCSW   03/05/2024

## 2024-03-06 ENCOUNTER — Encounter: Payer: Self-pay | Admitting: Psychiatry

## 2024-03-06 ENCOUNTER — Telehealth (INDEPENDENT_AMBULATORY_CARE_PROVIDER_SITE_OTHER): Admitting: Psychiatry

## 2024-03-06 DIAGNOSIS — F419 Anxiety disorder, unspecified: Secondary | ICD-10-CM | POA: Diagnosis not present

## 2024-03-06 DIAGNOSIS — F331 Major depressive disorder, recurrent, moderate: Secondary | ICD-10-CM | POA: Diagnosis not present

## 2024-03-06 DIAGNOSIS — F603 Borderline personality disorder: Secondary | ICD-10-CM | POA: Diagnosis not present

## 2024-03-06 DIAGNOSIS — R632 Polyphagia: Secondary | ICD-10-CM

## 2024-03-06 DIAGNOSIS — G4700 Insomnia, unspecified: Secondary | ICD-10-CM

## 2024-03-26 NOTE — Progress Notes (Signed)
 Virtual Visit via Video Note  I connected with Troy Mahoney on 04/01/24 at  8:00 AM EDT by a video enabled telemedicine application and verified that I am speaking with the correct person using two identifiers.  Location: Patient: home Provider: office Persons participated in the visit- patient, provider    I discussed the limitations of evaluation and management by telemedicine and the availability of in person appointments. The patient expressed understanding and agreed to proceed.     I discussed the assessment and treatment plan with the patient. The patient was provided an opportunity to ask questions and all were answered. The patient agreed with the plan and demonstrated an understanding of the instructions.   The patient was advised to call back or seek an in-person evaluation if the symptoms worsen or if the condition fails to improve as anticipated.   [name], MD    [facility] MD/PA/NP OP Progress Note  04/01/2024 8:39 AM Troy Mahoney  MRN:  [id number]  Chief Complaint:  Chief Complaint  Patient presents with   Follow-up   HPI:  This is a follow-up appointment for depression, anxiety, insomnia and binge eating.  He states that his cat and kittens were killed by a dog.  He found them in the backyard.  He reports feeling down related to this, he has been sticking to the routine.  He reports concern of cannot getting words out at times.  It has been going on for over 1 year. This is not like him, stating that he was even on the Dean's list.  He thinks he needs to get more social.  He reports good sleep.  He is very active.  He is trying to be on hold of pursuing a job.  He reports less appetite, although he has been able to eat vegetables, fruits and get protein.  He denies binge eating.  He denies SI, HI, hallucinations. His anxiety has been relatively manageable. He agrees with the plans as outlined below.   Substance use   Tobacco Alcohol Other substances/   Current    denies Lit (250 mg caffeine), otherwise denies  Past        Past Treatment             Visit Diagnosis:    ICD-10-CM   1. MDD (major depressive disorder), recurrent episode, mild  F33.0     2. Anxiety disorder, unspecified type  F41.9     3. Borderline personality disorder (HCC)  F60.3     4. Insomnia, unspecified type  G47.00     5. Binge eating  R63.2       Past Psychiatric History: Please see initial evaluation for full details. I have reviewed the history. No updates at this time.     Past Medical History: History reviewed. No pertinent past medical history. History reviewed. No pertinent surgical history.  Family Psychiatric History: Please see initial evaluation for full details. I have reviewed the history. No updates at this time.     Family History:  Family History  Problem Relation Age of Onset   Anxiety disorder Mother    Depression Mother    Anxiety disorder Maternal Aunt    Anxiety disorder Maternal Grandmother     Social History:  Social History   Socioeconomic History   Marital status: Single    Spouse name: Not on file   Number of children: Not on file   Years of education: Not on file   Highest education level:  Not on file  Occupational History   Not on file  Tobacco Use   Smoking status: Never   Smokeless tobacco: Never  Vaping Use   Vaping status: Every Day   Substances: Flavoring  Substance and Sexual Activity   Alcohol use: Not Currently    Comment: occ.   Drug use: No   Sexual activity: Not on file  Other Topics Concern   Not on file  Social History Narrative   Not on file   Social Drivers of Health   Financial Resource Strain: Not on file  Food Insecurity: Not on file  Transportation Needs: Not on file  Physical Activity: Not on file  Stress: Not on file  Social Connections: Not on file    Allergies: No Known Allergies  Metabolic Disorder Labs: No results found for: HGBA1C, MPG No results found for:  PROLACTIN No results found for: CHOL, TRIG, HDL, CHOLHDL, VLDL, LDLCALC Lab Results  Component Value Date   TSH 0.69 08/28/2018    Therapeutic Level Labs: No results found for: LITHIUM No results found for: VALPROATE No results found for: CBMZ  Current Medications: Current Outpatient Medications  Medication Sig Dispense Refill   amLODipine (NORVASC) 5 MG tablet TAKE 1 TABLET BY MOUTH DAILY WITH OLMERSARTAN     ARIPiprazole  (ABILIFY ) 20 MG tablet Take 1 tablet (20 mg total) by mouth daily. 30 tablet 3   [START ON 04/08/2024] buPROPion  (WELLBUTRIN  XL) 150 MG 24 hr tablet Take 1 tablet (150 mg total) by mouth daily. Take total of 450 mg daily. Take along with 300 mg tab 30 tablet 5   [START ON 04/08/2024] buPROPion  (WELLBUTRIN  XL) 300 MG 24 hr tablet Take 1 tablet (300 mg total) by mouth daily. Take total of 450 mg daily. Take along with 150 mg tab 30 tablet 5   escitalopram  (LEXAPRO ) 20 MG tablet Take 1 tablet (20 mg total) by mouth daily. 30 tablet 5   hydrochlorothiazide (MICROZIDE) 12.5 MG capsule Take 12.5 mg by mouth daily.     ibuprofen (ADVIL,MOTRIN) 200 MG tablet Take 200 mg by mouth every 6 (six) hours as needed for mild pain or moderate pain.     olmesartan (BENICAR) 20 MG tablet TAKE 1 TABLET BY MOUTH DAILY FOR BLOOD PRESSURE WITH AMLODIPINE     prazosin  (MINIPRESS ) 1 MG capsule Take 1 capsule (1 mg total) by mouth at bedtime. 30 capsule 3   traZODone  (DESYREL ) 50 MG tablet Take 1 tablet (50 mg total) by mouth at bedtime as needed for sleep. 30 tablet 5   No current facility-administered medications for this visit.     Musculoskeletal: Strength & Muscle Tone: N/A Gait & Station: N/A Patient leans: N/A  Psychiatric Specialty Exam: Review of Systems  Psychiatric/Behavioral:  Positive for dysphoric mood. Negative for agitation, behavioral problems, confusion, decreased concentration, hallucinations, self-injury, sleep disturbance and suicidal ideas. The  patient is nervous/anxious. The patient is not hyperactive.   All other systems reviewed and are negative.   There were no vitals taken for this visit.There is no height or weight on file to calculate BMI.  General Appearance: Well Groomed  Eye Contact:  Good  Speech:  Clear and Coherent  Volume:  Normal  Mood:  consistent  Affect:  Appropriate, Congruent, and calm  Thought Process:  Coherent  Orientation:  Full (Time, Place, and Person)  Thought Content: Logical   Suicidal Thoughts:  No  Homicidal Thoughts:  No  Memory:  Immediate;   Good  Judgement:  Good  Insight:  Good  Psychomotor Activity:  Normal  Concentration:  Concentration: Good and Attention Span: Good  Recall:  Good  Fund of Knowledge: Good  Language: Good  Akathisia:  No  Handed:  Right  AIMS (if indicated): not done  Assets:  Communication Skills Desire for Improvement  ADL's:  Intact  Cognition: WNL  Sleep:  Good   Screenings: GAD-7    Flowsheet Row Counselor from 12/11/2023 in [facility] [place] Office Visit from confidential encounter on 03/29/2022 Counselor from 08/23/2021 in [facility] [place]  Total GAD-7 Score 21 12 16    PHQ2-9    Flowsheet Row Counselor from 12/11/2023 in [facility] [place] Office Visit from confidential encounter on 03/29/2022 Counselor from 08/23/2021 in [facility] [place] Video Visit from confidential encounter on 07/14/2021 Video Visit from confidential encounter on 03/10/2021  PHQ-2 Total Score 6 4 3 4 2   PHQ-9 Total Score 14 11 10 19 11    Flowsheet Row Counselor from 12/11/2023 in [facility] [place] Counselor from 08/23/2021 in [facility] [place] Video Visit from confidential encounter on 07/14/2021  C-SSRS RISK CATEGORY Error: Question 6 not populated Error: Q3, 4, or 5 should  not be populated when Q2 is No Error: Q3, 4, or 5 should not be populated when Q2 is No     Assessment and Plan:  Troy Mahoney is a 33 y.o. year old male with a history of depression, hypertension, who presents for follow up appointment for below.   1. MDD (major depressive disorder), recurrent episode, mild 2. Anxiety disorder, unspecified type 3. 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 He reports better relationship with his sister.  History:   Although he reports worsening in down mood in the context of loss of his cat and kittens, who were killed by her dog, these appears to be within expected range.  He has successfully established a structured routine, which includes regular workouts. He is also taking time before returning to work and is concerned about pursuing school.  Will maintain current medication regimen at this time.  Will continue Lexapro  to target depression and anxiety.  Will continue bupropion  and Abilify  as adjunctive treatment for depression.  Will continue prazosin  for nightmares.  Will continue to see Mr. Franchot for therapy.   # word finding difficulty Newly addressed.  He reports occasional word finding difficulty, which is unusual for him.  This has been going on for 1 year.  He thinks it occurs less when he speaks with Mr. Franchot, or with this Clinical research associate. Stress and anxiety are likely contributing factors. Psychoeducation was provided regarding the role of avoidance in maintaining fear. He expressed motivation to engage in practice.   4. Insomnia, unspecified type Overall improving.  Will continue current dose of trazodone  as needed for insomnia. It was previously discussed that he would contact the clinic for evaluation of sleep apnea given he has history of snoring, fatigue and middle insomnia.  Will discuss this at his next visit.   5. Binge eating He reports decrease in  appetite, and denies binge eating except the time he lost his cats.  Will taper off topiramate .  He has no known history of seizure.    4. High risk medication use       Last  checked  EKG HR 91, QTc457msec 02/2022  Lipid panels   Due- he will be checked at PCP  HbA1c   Due- he will be checked at  PCP       Plan  Continue lexapro  20 mg daily  Continue bupropion  450 mg daily Continue Abilify  20 mg daily    Decrease topiramate  25 mg daily for one week, then discontinue Continue prazosin  1 mg at night - 2 mg caused dizziness Continue trazodone  25 -50 mg at night as needed  Referred for sleep evaluation Next appointment:  11/25 at 8 am, video [email or web address]    He declined the needs to fill out any additional paperwork.   Past trials of medication: sertraline  (insomnia) , bupropion , lamotrigine  (weight gain)   The patient demonstrates the following risk factors for suicide: Chronic risk factors for suicide include: psychiatric disorder of depression. Acute risk factors for suicide include: loss (financial, interpersonal, professional). Protective factors for this patient include: responsibility to others (children, family), coping skills and hope for the future. Considering these factors, the overall suicide risk at this point appears to be low. He denies gun access at home. Patient is appropriate for outpatient follow up Emergency resources which includes 911, ED, suicide crisis line (988) are discussed.    Collaboration of Care: Collaboration of Care: Other reviewed notes in Epic  Patient/Guardian was advised Release of Information must be obtained prior to any record release in order to collaborate their care with an outside provider. Patient/Guardian was advised if they have not already done so to contact the registration department to sign all necessary forms in order for us  to release information regarding their care.   Consent: Patient/Guardian gives verbal consent for treatment  and assignment of benefits for services provided during this visit. Patient/Guardian expressed understanding and agreed to proceed.    [name], MD 04/01/2024, 8:39 AM

## 2024-04-01 ENCOUNTER — Encounter: Payer: Self-pay | Admitting: Psychiatry

## 2024-04-01 ENCOUNTER — Telehealth (INDEPENDENT_AMBULATORY_CARE_PROVIDER_SITE_OTHER): Admitting: Psychiatry

## 2024-04-01 DIAGNOSIS — F419 Anxiety disorder, unspecified: Secondary | ICD-10-CM

## 2024-04-01 DIAGNOSIS — F603 Borderline personality disorder: Secondary | ICD-10-CM | POA: Diagnosis not present

## 2024-04-01 DIAGNOSIS — R632 Polyphagia: Secondary | ICD-10-CM

## 2024-04-01 DIAGNOSIS — G4700 Insomnia, unspecified: Secondary | ICD-10-CM

## 2024-04-01 DIAGNOSIS — F330 Major depressive disorder, recurrent, mild: Secondary | ICD-10-CM | POA: Diagnosis not present

## 2024-04-01 MED ORDER — BUPROPION HCL ER (XL) 150 MG PO TB24: 150.0000 mg | ORAL_TABLET | Freq: Every day | ORAL | 5 refills | Status: AC

## 2024-04-01 MED ORDER — BUPROPION HCL ER (XL) 300 MG PO TB24: 300.0000 mg | ORAL_TABLET | Freq: Every day | ORAL | 5 refills | Status: AC

## 2024-04-01 MED ORDER — ARIPIPRAZOLE 20 MG PO TABS: 20.0000 mg | ORAL_TABLET | Freq: Every day | ORAL | 3 refills | Status: AC

## 2024-04-01 NOTE — Patient Instructions (Addendum)
 Continue lexapro  20 mg daily  Continue bupropion  450 mg daily Continue Abilify  20 mg daily    Decrease topiramate  25 mg daily for one week, then discontinue Continue prazosin  1 mg at night  Continue trazodone  25 -50 mg at night as needed  Next appointment:  11/25 at 8 am,

## 2024-04-08 ENCOUNTER — Ambulatory Visit (HOSPITAL_COMMUNITY): Admitting: Clinical

## 2024-04-15 ENCOUNTER — Ambulatory Visit (INDEPENDENT_AMBULATORY_CARE_PROVIDER_SITE_OTHER): Admitting: Clinical

## 2024-04-15 DIAGNOSIS — F419 Anxiety disorder, unspecified: Secondary | ICD-10-CM

## 2024-04-15 DIAGNOSIS — F331 Major depressive disorder, recurrent, moderate: Secondary | ICD-10-CM | POA: Diagnosis not present

## 2024-04-15 NOTE — Progress Notes (Signed)
 Virtual Visit via Video Note   I connected with Troy Mahoney on 04/15/24 at 9:00 AM EST by a video enabled telemedicine application and verified that I am speaking with the correct person using two identifiers.   Location: Patient: Home Provider: Office   I discussed the limitations of evaluation and management by telemedicine and the availability of in person appointments. The patient expressed understanding and agreed to proceed.       THERAPIST PROGRESS NOTE   Session Time: 9:00 AM-9:37 AM   Participation Level: Active   Behavioral Response: CasualAlertDepressed   Type of Therapy: Individual Therapy   Treatment Goals addressed: Coping   Interventions: CBT, Motivational Interviewing, Solution Focused and Supportive   Summary: Troy Mahoney is a 33 y.o. male who presents with Depression and Anxiety The OPT therapist worked with the patient for his OPT treatment. The OPT therapist utilized Motivational Interviewing to assist in creating therapeutic repore. The patient in the session was engaged and spoke about  his focus currently on finding a new job but also working to stay positive. The OPT therapist worked with the patient utilizing in session practice of in my control vs not in my control. The OPT therapist worked with the patient on having a framework for his week including pacing, time management, and focus on basic needs.The OPT therapist utilized Cognitive Behavioral Therapy through cognitive restructuring as well as worked with the patient on coping strategies to assist in management of Depression and Anxiety. The OPT therapist worked with the patient on self awareness of pacing his day and spoke about utilizing a time Automotive engineer Scientist, clinical (histocompatibility and immunogenetics)) to keep task focused but also mentally establish a healthier pace. The OPT therapist overviewed with the patient his basic need areas and ensuring to continue to manage his exercise, eating habits , sleep cycle, and hygiene. The  patient spoke about having a positive lens working on not holding himself accountable in areas he does not have direct control of change in and feeling better about his situation with having support of therapy.   Suicidal/Homicidal: Nowithout intent/plan   Therapist Response: The OPT therapist worked with the patient for the patients scheduled session. The patient was engaged in his session and gave feedback in relation to triggers, symptoms, and behavior responses over the past few weeks. 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 The OPT therapist worked with the patient utilizing an in session Cognitive Behavioral Therapy exercise.The OPT therapist worked with the patient on postive thinking. The OPT therapist worked with the patient on time management and prioritizing health. The patient in this session verbalized,  I realize that I have to give myself grace and work on things day by day and not get tangled in worrying but use my distractions when I need to. The OPT therapist worked with the patient on stress management , not neglecting his basic needs, and creating balance across his life sectors while working on what is in his control and not fixating on things that are outside of his control.The OPT therapist worked with the patient on strategy to negate the negative impact of season change and seasonal depression. The OPT therapist will continue treatment work with the patient in his next scheduled session   Plan: Return again in 2/3 weeks.   Diagnosis:      Axis  I: Recurrent moderate major depressive disorder with anxiety                           Axis II: No diagnosis       Collaboration of Care: The OPT therapist overviewed the patient involvement in the med management program with Dr. Vickey.   Patient/Guardian was advised Release of  Information must be obtained prior to any record release in order to collaborate their care with an outside provider. Patient/Guardian was advised if they have not already done so to contact the registration department to sign all necessary forms in order for us  to release information regarding their care.    Consent: Patient/Guardian gives verbal consent for treatment and assignment of benefits for services provided during this visit. Patient/Guardian expressed understanding and agreed to proceed.      I discussed the assessment and treatment plan with the patient. The patient was provided an opportunity to ask questions and all were answered. The patient agreed with the plan and demonstrated an understanding of the instructions.   The patient was advised to call back or seek an in-person evaluation if the symptoms worsen or if the condition fails to improve as anticipated.   I provided 37 minutes of non-face-to-face time during this encounter   Jerel T.Franchot, LCSW   04/15/2024

## 2024-05-13 ENCOUNTER — Ambulatory Visit (HOSPITAL_COMMUNITY): Admitting: Clinical

## 2024-05-23 NOTE — Progress Notes (Deleted)
 BH MD/PA/NP OP Progress Note  05/23/2024 9:48 AM Troy Mahoney  MRN:  [id number]  Chief Complaint: No chief complaint on file.  HPI: ***  Topiramte reduced   Substance use   Tobacco Alcohol Other substances/  Current    denies Lit (250 mg caffeine), otherwise denies  Past        Past Treatment             Visit Diagnosis: No diagnosis found.  Past Psychiatric History: Please see initial evaluation for full details. I have reviewed the history. No updates at this time.     Past Medical History: No past medical history on file. No past surgical history on file.  Family Psychiatric History: Please see initial evaluation for full details. I have reviewed the history. No updates at this time.     Family History:  Family History  Problem Relation Age of Onset   Anxiety disorder Mother    Depression Mother    Anxiety disorder Maternal Aunt    Anxiety disorder Maternal Grandmother     Social History:  Social History   Socioeconomic History   Marital status: Single    Spouse name: Not on file   Number of children: Not on file   Years of education: Not on file   Highest education level: Not on file  Occupational History   Not on file  Tobacco Use   Smoking status: Never   Smokeless tobacco: Never  Vaping Use   Vaping status: Every Day   Substances: Flavoring  Substance and Sexual Activity   Alcohol use: Not Currently    Comment: occ.   Drug use: No   Sexual activity: Not on file  Other Topics Concern   Not on file  Social History Narrative   Not on file   Social Drivers of Health   Financial Resource Strain: Not on file  Food Insecurity: Not on file  Transportation Needs: Not on file  Physical Activity: Not on file  Stress: Not on file  Social Connections: Not on file    Allergies: No Known Allergies  Metabolic Disorder Labs: No results found for: HGBA1C, MPG No results found for: PROLACTIN No results found for: CHOL, TRIG, HDL,  CHOLHDL, VLDL, LDLCALC Lab Results  Component Value Date   TSH 0.69 08/28/2018    Therapeutic Level Labs: No results found for: LITHIUM No results found for: VALPROATE No results found for: CBMZ  Current Medications: Current Outpatient Medications  Medication Sig Dispense Refill   amLODipine (NORVASC) 5 MG tablet TAKE 1 TABLET BY MOUTH DAILY WITH OLMERSARTAN     ARIPiprazole  (ABILIFY ) 20 MG tablet Take 1 tablet (20 mg total) by mouth daily. 30 tablet 3   buPROPion  (WELLBUTRIN  XL) 150 MG 24 hr tablet Take 1 tablet (150 mg total) by mouth daily. Take total of 450 mg daily. Take along with 300 mg tab 30 tablet 5   buPROPion  (WELLBUTRIN  XL) 300 MG 24 hr tablet Take 1 tablet (300 mg total) by mouth daily. Take total of 450 mg daily. Take along with 150 mg tab 30 tablet 5   escitalopram  (LEXAPRO ) 20 MG tablet Take 1 tablet (20 mg total) by mouth daily. 30 tablet 5   hydrochlorothiazide (MICROZIDE) 12.5 MG capsule Take 12.5 mg by mouth daily.     ibuprofen (ADVIL,MOTRIN) 200 MG tablet Take 200 mg by mouth every 6 (six) hours as needed for mild pain or moderate pain.     olmesartan (BENICAR) 20 MG tablet  TAKE 1 TABLET BY MOUTH DAILY FOR BLOOD PRESSURE WITH AMLODIPINE     prazosin  (MINIPRESS ) 1 MG capsule Take 1 capsule (1 mg total) by mouth at bedtime. 30 capsule 3   traZODone  (DESYREL ) 50 MG tablet Take 1 tablet (50 mg total) by mouth at bedtime as needed for sleep. 30 tablet 5   No current facility-administered medications for this visit.     Musculoskeletal: Strength & Muscle Tone: N/A Gait & Station: N/A Patient leans: N/A  Psychiatric Specialty Exam: Review of Systems  There were no vitals taken for this visit.There is no height or weight on file to calculate BMI.  General Appearance: {Appearance:22683}  Eye Contact:  {[facility] EYE CONTACT:22684}  Speech:  Clear and Coherent  Volume:  Normal  Mood:  {B[facility] MOOD:22306}  Affect:  {Affect (PAA):22687}  Thought Process:   Coherent  Orientation:  Full (Time, Place, and Person)  Thought Content: Logical   Suicidal Thoughts:  {ST/HT (PAA):22692}  Homicidal Thoughts:  {ST/HT (PAA):22692}  Memory:  Immediate;   Good  Judgement:  {Judgement (PAA):22694}  Insight:  {Insight (PAA):22695}  Psychomotor Activity:  Normal  Concentration:  Concentration: Good and Attention Span: Good  Recall:  Good  Fund of Knowledge: Good  Language: Good  Akathisia:  No  Handed:  Right  AIMS (if indicated): not done  Assets:  Communication Skills Desire for Improvement  ADL's:  Intact  Cognition: WNL  Sleep:  {[facility] GOOD/FAIR/POOR:22877}   Screenings: GAD-7    Advertising Copywriter from 12/11/2023 in [facility] [place] Office Visit from confidential encounter on 03/29/2022 Counselor from 08/23/2021 in [facility] [place]  Total GAD-7 Score 21 12 16    PHQ2-9    Flowsheet Row Counselor from 12/11/2023 in [facility] [place] Office Visit from confidential encounter on 03/29/2022 Counselor from 08/23/2021 in [facility] [place] Video Visit from confidential encounter on 07/14/2021 Video Visit from confidential encounter on 03/10/2021  PHQ-2 Total Score 6 4 3 4 2   PHQ-9 Total Score 14 11 10 19 11    Flowsheet Row Counselor from 12/11/2023 in [facility] [place] Counselor from 08/23/2021 in [facility] [place] Video Visit from confidential encounter on 07/14/2021  C-SSRS RISK CATEGORY Error: Question 6 not populated Error: Q3, 4, or 5 should not be populated when Q2 is No Error: Q3, 4, or 5 should not be populated when Q2 is No     Assessment and Plan:  Troy Mahoney is a 33 y.o. year old male with a history of depression, hypertension, who presents for follow up appointment for below.    1. MDD (major depressive disorder),  recurrent episode, mild 2. Anxiety disorder, unspecified type 3. 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 He reports better relationship with his sister.  History:   Although he reports worsening in down mood in the context of loss of his cat and kittens, who were killed by her dog, these appears to be within expected range.  He has successfully established a structured routine, which includes regular workouts. He is also taking time before returning to work and is concerned about pursuing school.  Will maintain current medication regimen at this time.  Will continue Lexapro  to target depression and anxiety.  Will continue bupropion  and Abilify  as adjunctive treatment for depression.  Will continue prazosin  for nightmares.  Will continue to see Mr. Franchot for therapy.    # word finding difficulty Newly addressed.  He reports occasional word finding difficulty, which is unusual for him.  This has been going on for 1 year.  He thinks it occurs less when he speaks with Mr. Franchot, or with this clinical research associate. Stress and anxiety are likely contributing factors. Psychoeducation was provided regarding the role of avoidance in maintaining fear. He expressed motivation to engage in practice.    4. Insomnia, unspecified type Overall improving.  Will continue current dose of trazodone  as needed for insomnia. It was previously discussed that he would contact the clinic for evaluation of sleep apnea given he has history of snoring, fatigue and middle insomnia.  Will discuss this at his next visit.    5. Binge eating He reports decrease in appetite, and denies binge eating except the time he lost his cats.  Will taper off topiramate .  He has no known history of seizure.    4. High risk medication use       Last checked  EKG HR 91, QTc473msec 02/2022  Lipid panels   Due- he will be checked at PCP  HbA1c   Due- he will be  checked at  PCP       Plan  Continue lexapro  20 mg daily  Continue bupropion  450 mg daily Continue Abilify  20 mg daily    Decrease topiramate  25 mg daily for one week, then discontinue Continue prazosin  1 mg at night - 2 mg caused dizziness Continue trazodone  25 -50 mg at night as needed  Referred for sleep evaluation Next appointment:  11/25 at 8 am, video [email or web address]    He declined the needs to fill out any additional paperwork.   Past trials of medication: sertraline  (insomnia) , bupropion , lamotrigine  (weight gain)   The patient demonstrates the following risk factors for suicide: Chronic risk factors for suicide include: psychiatric disorder of depression. Acute risk factors for suicide include: loss (financial, interpersonal, professional). Protective factors for this patient include: responsibility to others (children, family), coping skills and hope for the future. Considering these factors, the overall suicide risk at this point appears to be low. He denies gun access at home. Patient is appropriate for outpatient follow up Emergency resources which includes 911, ED, suicide crisis line (988) are discussed.    Collaboration of Care: Collaboration of Care: {BH OP Collaboration of Care:21014065}  Patient/Guardian was advised Release of Information must be obtained prior to any record release in order to collaborate their care with an outside provider. Patient/Guardian was advised if they have not already done so to contact the registration department to sign all necessary forms in order for us  to release information regarding their care.   Consent: Patient/Guardian gives verbal consent for treatment and assignment of benefits for services provided during this visit. Patient/Guardian expressed understanding and agreed to proceed.    [name], MD 05/23/2024, 9:48 AM

## 2024-05-28 ENCOUNTER — Telehealth: Admitting: Psychiatry
